# Patient Record
Sex: Female | Born: 1935 | Race: White | Hispanic: No | Smoking: Former smoker
Health system: Southern US, Community
[De-identification: ages and names within clinical notes are randomized; demographics above are authoritative.]

## PROBLEM LIST (undated history)

## (undated) DIAGNOSIS — J439 Emphysema, unspecified: Secondary | ICD-10-CM

## (undated) DIAGNOSIS — Z9981 Dependence on supplemental oxygen: Secondary | ICD-10-CM

## (undated) DIAGNOSIS — H04129 Dry eye syndrome of unspecified lacrimal gland: Secondary | ICD-10-CM

## (undated) DIAGNOSIS — F0390 Unspecified dementia without behavioral disturbance: Secondary | ICD-10-CM

## (undated) DIAGNOSIS — F32A Depression, unspecified: Secondary | ICD-10-CM

## (undated) DIAGNOSIS — B977 Papillomavirus as the cause of diseases classified elsewhere: Secondary | ICD-10-CM

## (undated) DIAGNOSIS — M5136 Other intervertebral disc degeneration, lumbar region: Secondary | ICD-10-CM

## (undated) DIAGNOSIS — J42 Unspecified chronic bronchitis: Secondary | ICD-10-CM

## (undated) DIAGNOSIS — T7840XA Allergy, unspecified, initial encounter: Secondary | ICD-10-CM

## (undated) DIAGNOSIS — R918 Other nonspecific abnormal finding of lung field: Secondary | ICD-10-CM

## (undated) DIAGNOSIS — M51369 Other intervertebral disc degeneration, lumbar region without mention of lumbar back pain or lower extremity pain: Secondary | ICD-10-CM

## (undated) DIAGNOSIS — F329 Major depressive disorder, single episode, unspecified: Secondary | ICD-10-CM

## (undated) DIAGNOSIS — B009 Herpesviral infection, unspecified: Secondary | ICD-10-CM

## (undated) DIAGNOSIS — K219 Gastro-esophageal reflux disease without esophagitis: Secondary | ICD-10-CM

## (undated) HISTORY — PX: EYE SURGERY: SHX253

## (undated) HISTORY — DX: Papillomavirus as the cause of diseases classified elsewhere: B97.7

## (undated) HISTORY — DX: Dry eye syndrome of unspecified lacrimal gland: H04.129

## (undated) HISTORY — DX: Other intervertebral disc degeneration, lumbar region: M51.36

## (undated) HISTORY — DX: Gastro-esophageal reflux disease without esophagitis: K21.9

## (undated) HISTORY — DX: Allergy, unspecified, initial encounter: T78.40XA

## (undated) HISTORY — DX: Herpesviral infection, unspecified: B00.9

## (undated) HISTORY — PX: OTHER SURGICAL HISTORY: SHX169

## (undated) HISTORY — DX: Other nonspecific abnormal finding of lung field: R91.8

## (undated) HISTORY — DX: Depression, unspecified: F32.A

## (undated) HISTORY — DX: Major depressive disorder, single episode, unspecified: F32.9

## (undated) HISTORY — DX: Emphysema, unspecified: J43.9

## (undated) HISTORY — PX: LASER ABLATION OF THE CERVIX: SHX1949

## (undated) HISTORY — DX: Other intervertebral disc degeneration, lumbar region without mention of lumbar back pain or lower extremity pain: M51.369

---

## 2002-08-30 ENCOUNTER — Ambulatory Visit (HOSPITAL_COMMUNITY): Admission: RE | Admit: 2002-08-30 | Discharge: 2002-08-30 | Payer: Self-pay | Admitting: Pulmonary Disease

## 2002-10-06 ENCOUNTER — Ambulatory Visit (HOSPITAL_COMMUNITY): Admission: RE | Admit: 2002-10-06 | Discharge: 2002-10-06 | Payer: Self-pay | Admitting: Pulmonary Disease

## 2002-12-31 ENCOUNTER — Ambulatory Visit (HOSPITAL_COMMUNITY): Admission: RE | Admit: 2002-12-31 | Discharge: 2002-12-31 | Payer: Self-pay | Admitting: Pulmonary Disease

## 2003-10-24 ENCOUNTER — Ambulatory Visit (HOSPITAL_COMMUNITY): Admission: RE | Admit: 2003-10-24 | Discharge: 2003-10-24 | Payer: Self-pay | Admitting: Pulmonary Disease

## 2003-11-28 ENCOUNTER — Ambulatory Visit (HOSPITAL_COMMUNITY): Admission: RE | Admit: 2003-11-28 | Discharge: 2003-11-28 | Payer: Self-pay | Admitting: Pulmonary Disease

## 2005-01-21 ENCOUNTER — Ambulatory Visit (HOSPITAL_COMMUNITY): Admission: RE | Admit: 2005-01-21 | Discharge: 2005-01-21 | Payer: Self-pay | Admitting: Pulmonary Disease

## 2005-07-02 ENCOUNTER — Ambulatory Visit (HOSPITAL_COMMUNITY): Admission: RE | Admit: 2005-07-02 | Discharge: 2005-07-02 | Payer: Self-pay | Admitting: Pulmonary Disease

## 2005-07-23 ENCOUNTER — Ambulatory Visit: Payer: Self-pay | Admitting: Family Medicine

## 2005-07-23 ENCOUNTER — Ambulatory Visit (HOSPITAL_COMMUNITY): Admission: RE | Admit: 2005-07-23 | Discharge: 2005-07-23 | Payer: Self-pay | Admitting: Family Medicine

## 2005-07-25 ENCOUNTER — Ambulatory Visit (HOSPITAL_COMMUNITY): Admission: RE | Admit: 2005-07-25 | Discharge: 2005-07-25 | Payer: Self-pay | Admitting: Family Medicine

## 2005-07-27 ENCOUNTER — Emergency Department (HOSPITAL_COMMUNITY): Admission: EM | Admit: 2005-07-27 | Discharge: 2005-07-27 | Payer: Self-pay | Admitting: Emergency Medicine

## 2005-08-21 ENCOUNTER — Ambulatory Visit: Payer: Self-pay | Admitting: Family Medicine

## 2005-09-20 ENCOUNTER — Ambulatory Visit: Payer: Self-pay | Admitting: Family Medicine

## 2005-10-10 ENCOUNTER — Ambulatory Visit: Payer: Self-pay | Admitting: Internal Medicine

## 2005-12-19 ENCOUNTER — Ambulatory Visit (HOSPITAL_COMMUNITY): Admission: RE | Admit: 2005-12-19 | Discharge: 2005-12-19 | Payer: Self-pay | Admitting: Internal Medicine

## 2005-12-19 ENCOUNTER — Encounter (INDEPENDENT_AMBULATORY_CARE_PROVIDER_SITE_OTHER): Payer: Self-pay | Admitting: Internal Medicine

## 2005-12-19 ENCOUNTER — Ambulatory Visit: Payer: Self-pay | Admitting: Internal Medicine

## 2006-01-16 ENCOUNTER — Ambulatory Visit: Payer: Self-pay | Admitting: Family Medicine

## 2006-01-17 ENCOUNTER — Ambulatory Visit: Payer: Self-pay | Admitting: Family Medicine

## 2006-01-20 ENCOUNTER — Ambulatory Visit (HOSPITAL_COMMUNITY): Admission: RE | Admit: 2006-01-20 | Discharge: 2006-01-20 | Payer: Self-pay | Admitting: Family Medicine

## 2006-01-23 ENCOUNTER — Ambulatory Visit (HOSPITAL_COMMUNITY): Admission: RE | Admit: 2006-01-23 | Discharge: 2006-01-23 | Payer: Self-pay | Admitting: Family Medicine

## 2006-02-13 ENCOUNTER — Ambulatory Visit: Payer: Self-pay | Admitting: Family Medicine

## 2006-03-12 ENCOUNTER — Encounter: Payer: Self-pay | Admitting: Internal Medicine

## 2006-03-13 ENCOUNTER — Ambulatory Visit: Payer: Self-pay | Admitting: Family Medicine

## 2006-03-18 ENCOUNTER — Ambulatory Visit (HOSPITAL_COMMUNITY): Admission: RE | Admit: 2006-03-18 | Discharge: 2006-03-18 | Payer: Self-pay | Admitting: Family Medicine

## 2006-03-28 ENCOUNTER — Encounter (INDEPENDENT_AMBULATORY_CARE_PROVIDER_SITE_OTHER): Payer: Self-pay | Admitting: Specialist

## 2006-03-28 ENCOUNTER — Other Ambulatory Visit: Admission: RE | Admit: 2006-03-28 | Discharge: 2006-03-28 | Payer: Self-pay | Admitting: Family Medicine

## 2006-03-28 ENCOUNTER — Ambulatory Visit: Payer: Self-pay | Admitting: Family Medicine

## 2006-03-28 LAB — CONVERTED CEMR LAB: Pap Smear: ABNORMAL

## 2006-03-31 ENCOUNTER — Encounter (INDEPENDENT_AMBULATORY_CARE_PROVIDER_SITE_OTHER): Payer: Self-pay | Admitting: Family Medicine

## 2006-05-06 ENCOUNTER — Ambulatory Visit: Payer: Self-pay | Admitting: Family Medicine

## 2006-05-12 ENCOUNTER — Ambulatory Visit: Payer: Self-pay | Admitting: Family Medicine

## 2006-05-19 ENCOUNTER — Ambulatory Visit: Payer: Self-pay | Admitting: Family Medicine

## 2006-05-21 ENCOUNTER — Emergency Department (HOSPITAL_COMMUNITY): Admission: EM | Admit: 2006-05-21 | Discharge: 2006-05-21 | Payer: Self-pay | Admitting: Emergency Medicine

## 2006-06-16 ENCOUNTER — Ambulatory Visit: Payer: Self-pay | Admitting: Family Medicine

## 2006-07-07 ENCOUNTER — Ambulatory Visit: Payer: Self-pay | Admitting: Family Medicine

## 2006-07-14 ENCOUNTER — Ambulatory Visit: Payer: Self-pay | Admitting: Family Medicine

## 2006-07-15 ENCOUNTER — Ambulatory Visit (HOSPITAL_COMMUNITY): Admission: RE | Admit: 2006-07-15 | Discharge: 2006-07-15 | Payer: Self-pay | Admitting: Family Medicine

## 2006-08-04 ENCOUNTER — Ambulatory Visit: Payer: Self-pay | Admitting: Family Medicine

## 2006-08-19 ENCOUNTER — Ambulatory Visit: Payer: Self-pay | Admitting: Family Medicine

## 2006-09-16 ENCOUNTER — Ambulatory Visit: Payer: Self-pay | Admitting: Family Medicine

## 2006-09-17 ENCOUNTER — Ambulatory Visit: Payer: Self-pay | Admitting: Family Medicine

## 2006-10-21 ENCOUNTER — Ambulatory Visit: Payer: Self-pay | Admitting: Family Medicine

## 2006-10-22 ENCOUNTER — Encounter (INDEPENDENT_AMBULATORY_CARE_PROVIDER_SITE_OTHER): Payer: Self-pay | Admitting: Family Medicine

## 2006-10-22 LAB — CONVERTED CEMR LAB
Blood Glucose, Fasting: 97 mg/dL
RBC count: 4.43 10*6/uL

## 2006-11-07 ENCOUNTER — Ambulatory Visit: Payer: Self-pay | Admitting: Family Medicine

## 2006-11-08 ENCOUNTER — Encounter: Payer: Self-pay | Admitting: Family Medicine

## 2006-11-08 DIAGNOSIS — F411 Generalized anxiety disorder: Secondary | ICD-10-CM | POA: Insufficient documentation

## 2006-11-08 DIAGNOSIS — J309 Allergic rhinitis, unspecified: Secondary | ICD-10-CM | POA: Insufficient documentation

## 2006-11-08 DIAGNOSIS — K5900 Constipation, unspecified: Secondary | ICD-10-CM | POA: Insufficient documentation

## 2006-11-08 DIAGNOSIS — E785 Hyperlipidemia, unspecified: Secondary | ICD-10-CM

## 2006-11-08 DIAGNOSIS — K219 Gastro-esophageal reflux disease without esophagitis: Secondary | ICD-10-CM

## 2006-11-08 DIAGNOSIS — F329 Major depressive disorder, single episode, unspecified: Secondary | ICD-10-CM

## 2006-11-08 DIAGNOSIS — M810 Age-related osteoporosis without current pathological fracture: Secondary | ICD-10-CM | POA: Insufficient documentation

## 2006-11-12 ENCOUNTER — Ambulatory Visit: Payer: Self-pay | Admitting: Family Medicine

## 2006-12-18 ENCOUNTER — Emergency Department (HOSPITAL_COMMUNITY): Admission: EM | Admit: 2006-12-18 | Discharge: 2006-12-18 | Payer: Self-pay | Admitting: Emergency Medicine

## 2006-12-20 ENCOUNTER — Emergency Department (HOSPITAL_COMMUNITY): Admission: EM | Admit: 2006-12-20 | Discharge: 2006-12-20 | Payer: Self-pay | Admitting: Emergency Medicine

## 2006-12-29 ENCOUNTER — Ambulatory Visit: Payer: Self-pay | Admitting: Family Medicine

## 2006-12-31 ENCOUNTER — Ambulatory Visit (HOSPITAL_COMMUNITY): Admission: RE | Admit: 2006-12-31 | Discharge: 2006-12-31 | Payer: Self-pay | Admitting: Obstetrics & Gynecology

## 2007-01-05 ENCOUNTER — Ambulatory Visit: Payer: Self-pay | Admitting: Family Medicine

## 2007-01-12 ENCOUNTER — Ambulatory Visit: Payer: Self-pay | Admitting: Family Medicine

## 2007-02-20 ENCOUNTER — Encounter (INDEPENDENT_AMBULATORY_CARE_PROVIDER_SITE_OTHER): Payer: Self-pay | Admitting: Family Medicine

## 2007-03-17 ENCOUNTER — Ambulatory Visit: Payer: Self-pay | Admitting: Family Medicine

## 2007-03-23 ENCOUNTER — Ambulatory Visit (HOSPITAL_COMMUNITY): Admission: RE | Admit: 2007-03-23 | Discharge: 2007-03-23 | Payer: Self-pay | Admitting: Family Medicine

## 2007-03-25 ENCOUNTER — Encounter (INDEPENDENT_AMBULATORY_CARE_PROVIDER_SITE_OTHER): Payer: Self-pay | Admitting: Family Medicine

## 2007-03-26 LAB — CONVERTED CEMR LAB
ALT: 19 units/L (ref 0–35)
AST: 22 units/L (ref 0–37)
Calcium: 9.6 mg/dL (ref 8.4–10.5)
Chloride: 103 meq/L (ref 96–112)
Creatinine, Ser: 1.06 mg/dL (ref 0.40–1.20)
Sodium: 141 meq/L (ref 135–145)
Total CHOL/HDL Ratio: 2.5
Total Protein: 6.8 g/dL (ref 6.0–8.3)
VLDL: 19 mg/dL (ref 0–40)

## 2007-03-31 ENCOUNTER — Ambulatory Visit: Payer: Self-pay | Admitting: Family Medicine

## 2007-03-31 LAB — CONVERTED CEMR LAB
Cholesterol, target level: 200 mg/dL
HDL goal, serum: 40 mg/dL
LDL Goal: 160 mg/dL

## 2007-04-20 ENCOUNTER — Encounter (INDEPENDENT_AMBULATORY_CARE_PROVIDER_SITE_OTHER): Payer: Self-pay | Admitting: Family Medicine

## 2007-05-21 ENCOUNTER — Encounter (INDEPENDENT_AMBULATORY_CARE_PROVIDER_SITE_OTHER): Payer: Self-pay | Admitting: Family Medicine

## 2007-06-01 ENCOUNTER — Encounter (INDEPENDENT_AMBULATORY_CARE_PROVIDER_SITE_OTHER): Payer: Self-pay | Admitting: Family Medicine

## 2007-06-23 ENCOUNTER — Encounter (INDEPENDENT_AMBULATORY_CARE_PROVIDER_SITE_OTHER): Payer: Self-pay | Admitting: Family Medicine

## 2007-06-23 ENCOUNTER — Telehealth (INDEPENDENT_AMBULATORY_CARE_PROVIDER_SITE_OTHER): Payer: Self-pay | Admitting: Family Medicine

## 2007-06-26 ENCOUNTER — Ambulatory Visit: Payer: Self-pay | Admitting: Family Medicine

## 2007-06-27 ENCOUNTER — Encounter (INDEPENDENT_AMBULATORY_CARE_PROVIDER_SITE_OTHER): Payer: Self-pay | Admitting: Family Medicine

## 2007-06-29 LAB — CONVERTED CEMR LAB
ALT: 19 units/L (ref 0–35)
AST: 25 units/L (ref 0–37)
Albumin: 4.3 g/dL (ref 3.5–5.2)
Basophils Absolute: 0.1 10*3/uL (ref 0.0–0.1)
Basophils Relative: 2 % — ABNORMAL HIGH (ref 0–1)
Calcium: 9.5 mg/dL (ref 8.4–10.5)
Chloride: 103 meq/L (ref 96–112)
MCHC: 32.9 g/dL (ref 30.0–36.0)
Neutro Abs: 3.2 10*3/uL (ref 1.7–7.7)
Neutrophils Relative %: 58 % (ref 43–77)
Potassium: 4.1 meq/L (ref 3.5–5.3)
RBC: 4.4 M/uL (ref 3.87–5.11)
RDW: 13.9 % (ref 11.5–14.0)
Total Protein: 6.8 g/dL (ref 6.0–8.3)

## 2007-07-01 ENCOUNTER — Encounter (INDEPENDENT_AMBULATORY_CARE_PROVIDER_SITE_OTHER): Payer: Self-pay | Admitting: Family Medicine

## 2007-07-14 ENCOUNTER — Encounter (INDEPENDENT_AMBULATORY_CARE_PROVIDER_SITE_OTHER): Payer: Self-pay | Admitting: Family Medicine

## 2007-07-20 ENCOUNTER — Encounter (INDEPENDENT_AMBULATORY_CARE_PROVIDER_SITE_OTHER): Payer: Self-pay | Admitting: Family Medicine

## 2007-07-29 ENCOUNTER — Ambulatory Visit: Payer: Self-pay | Admitting: Family Medicine

## 2007-08-03 ENCOUNTER — Encounter (INDEPENDENT_AMBULATORY_CARE_PROVIDER_SITE_OTHER): Payer: Self-pay | Admitting: Family Medicine

## 2007-08-03 ENCOUNTER — Ambulatory Visit (HOSPITAL_COMMUNITY): Admission: RE | Admit: 2007-08-03 | Discharge: 2007-08-03 | Payer: Self-pay | Admitting: Family Medicine

## 2007-08-05 ENCOUNTER — Encounter (INDEPENDENT_AMBULATORY_CARE_PROVIDER_SITE_OTHER): Payer: Self-pay | Admitting: Family Medicine

## 2007-09-03 ENCOUNTER — Ambulatory Visit: Payer: Self-pay | Admitting: Family Medicine

## 2007-09-03 LAB — CONVERTED CEMR LAB
Ketones, urine, test strip: NEGATIVE
Protein, U semiquant: NEGATIVE
Urobilinogen, UA: 0.2

## 2007-09-04 ENCOUNTER — Encounter (INDEPENDENT_AMBULATORY_CARE_PROVIDER_SITE_OTHER): Payer: Self-pay | Admitting: Family Medicine

## 2007-09-04 LAB — CONVERTED CEMR LAB: RBC / HPF: NONE SEEN (ref <3)

## 2007-09-24 ENCOUNTER — Telehealth (INDEPENDENT_AMBULATORY_CARE_PROVIDER_SITE_OTHER): Payer: Self-pay | Admitting: *Deleted

## 2007-09-29 ENCOUNTER — Telehealth (INDEPENDENT_AMBULATORY_CARE_PROVIDER_SITE_OTHER): Payer: Self-pay | Admitting: Family Medicine

## 2007-10-01 ENCOUNTER — Telehealth (INDEPENDENT_AMBULATORY_CARE_PROVIDER_SITE_OTHER): Payer: Self-pay | Admitting: *Deleted

## 2007-10-02 ENCOUNTER — Ambulatory Visit: Payer: Self-pay | Admitting: Family Medicine

## 2007-10-05 ENCOUNTER — Ambulatory Visit: Payer: Self-pay | Admitting: Family Medicine

## 2007-10-06 ENCOUNTER — Ambulatory Visit (HOSPITAL_COMMUNITY): Admission: RE | Admit: 2007-10-06 | Discharge: 2007-10-06 | Payer: Self-pay | Admitting: Family Medicine

## 2007-10-06 ENCOUNTER — Ambulatory Visit: Payer: Self-pay | Admitting: Family Medicine

## 2007-10-06 LAB — CONVERTED CEMR LAB
ALT: 22 units/L (ref 0–35)
AST: 24 units/L (ref 0–37)
CK-MB: 2.4 ng/mL (ref 0.3–4.0)
Calcium: 9.1 mg/dL (ref 8.4–10.5)
Chloride: 102 meq/L (ref 96–112)
Creatinine, Ser: 0.96 mg/dL (ref 0.40–1.20)

## 2007-10-07 ENCOUNTER — Telehealth (INDEPENDENT_AMBULATORY_CARE_PROVIDER_SITE_OTHER): Payer: Self-pay | Admitting: *Deleted

## 2007-10-07 ENCOUNTER — Ambulatory Visit: Payer: Self-pay | Admitting: Family Medicine

## 2007-10-21 ENCOUNTER — Ambulatory Visit: Payer: Self-pay | Admitting: Family Medicine

## 2007-10-23 ENCOUNTER — Telehealth (INDEPENDENT_AMBULATORY_CARE_PROVIDER_SITE_OTHER): Payer: Self-pay | Admitting: *Deleted

## 2007-10-26 ENCOUNTER — Ambulatory Visit: Payer: Self-pay | Admitting: Family Medicine

## 2007-11-07 ENCOUNTER — Emergency Department (HOSPITAL_COMMUNITY): Admission: EM | Admit: 2007-11-07 | Discharge: 2007-11-07 | Payer: Self-pay | Admitting: Emergency Medicine

## 2007-11-11 ENCOUNTER — Ambulatory Visit: Payer: Self-pay | Admitting: Family Medicine

## 2007-11-21 ENCOUNTER — Encounter (INDEPENDENT_AMBULATORY_CARE_PROVIDER_SITE_OTHER): Payer: Self-pay | Admitting: Family Medicine

## 2007-11-23 ENCOUNTER — Encounter (INDEPENDENT_AMBULATORY_CARE_PROVIDER_SITE_OTHER): Payer: Self-pay | Admitting: Family Medicine

## 2007-12-09 ENCOUNTER — Telehealth (INDEPENDENT_AMBULATORY_CARE_PROVIDER_SITE_OTHER): Payer: Self-pay | Admitting: Family Medicine

## 2007-12-10 ENCOUNTER — Encounter: Payer: Self-pay | Admitting: Family Medicine

## 2007-12-16 ENCOUNTER — Ambulatory Visit: Payer: Self-pay | Admitting: Family Medicine

## 2007-12-17 ENCOUNTER — Encounter (INDEPENDENT_AMBULATORY_CARE_PROVIDER_SITE_OTHER): Payer: Self-pay | Admitting: Family Medicine

## 2007-12-17 ENCOUNTER — Telehealth (INDEPENDENT_AMBULATORY_CARE_PROVIDER_SITE_OTHER): Payer: Self-pay | Admitting: *Deleted

## 2007-12-17 LAB — CONVERTED CEMR LAB
Albumin: 4.2 g/dL (ref 3.5–5.2)
CO2: 22 meq/L (ref 19–32)
Calcium: 9.2 mg/dL (ref 8.4–10.5)
Chloride: 104 meq/L (ref 96–112)
Glucose, Bld: 86 mg/dL (ref 70–99)
Magnesium: 2.1 mg/dL (ref 1.5–2.5)
Phosphorus: 3.7 mg/dL (ref 2.3–4.6)
Potassium: 4.8 meq/L (ref 3.5–5.3)
Sodium: 138 meq/L (ref 135–145)
Total Bilirubin: 0.4 mg/dL (ref 0.3–1.2)
Total Protein: 6.5 g/dL (ref 6.0–8.3)

## 2007-12-23 ENCOUNTER — Ambulatory Visit: Payer: Self-pay | Admitting: Family Medicine

## 2007-12-24 ENCOUNTER — Telehealth (INDEPENDENT_AMBULATORY_CARE_PROVIDER_SITE_OTHER): Payer: Self-pay | Admitting: Family Medicine

## 2007-12-31 ENCOUNTER — Ambulatory Visit: Payer: Self-pay | Admitting: Family Medicine

## 2008-01-22 ENCOUNTER — Telehealth (INDEPENDENT_AMBULATORY_CARE_PROVIDER_SITE_OTHER): Payer: Self-pay | Admitting: Family Medicine

## 2008-01-25 ENCOUNTER — Encounter (INDEPENDENT_AMBULATORY_CARE_PROVIDER_SITE_OTHER): Payer: Self-pay | Admitting: *Deleted

## 2008-01-25 ENCOUNTER — Encounter (INDEPENDENT_AMBULATORY_CARE_PROVIDER_SITE_OTHER): Payer: Self-pay | Admitting: Family Medicine

## 2008-02-24 ENCOUNTER — Encounter (INDEPENDENT_AMBULATORY_CARE_PROVIDER_SITE_OTHER): Payer: Self-pay | Admitting: Family Medicine

## 2008-03-23 ENCOUNTER — Ambulatory Visit: Payer: Self-pay | Admitting: Family Medicine

## 2008-03-29 ENCOUNTER — Encounter (INDEPENDENT_AMBULATORY_CARE_PROVIDER_SITE_OTHER): Payer: Self-pay | Admitting: Family Medicine

## 2008-03-30 ENCOUNTER — Telehealth (INDEPENDENT_AMBULATORY_CARE_PROVIDER_SITE_OTHER): Payer: Self-pay | Admitting: *Deleted

## 2008-03-30 LAB — CONVERTED CEMR LAB
ALT: 15 units/L (ref 0–35)
AST: 21 units/L (ref 0–37)
Basophils Absolute: 0.1 10*3/uL (ref 0.0–0.1)
CO2: 28 meq/L (ref 19–32)
Calcium: 10.1 mg/dL (ref 8.4–10.5)
Chloride: 104 meq/L (ref 96–112)
Cholesterol: 178 mg/dL (ref 0–200)
Lymphocytes Relative: 29 % (ref 12–46)
Neutro Abs: 3 10*3/uL (ref 1.7–7.7)
Neutrophils Relative %: 57 % (ref 43–77)
Platelets: 245 10*3/uL (ref 150–400)
RDW: 13.8 % (ref 11.5–15.5)
Sodium: 142 meq/L (ref 135–145)
TSH: 2.004 microintl units/mL (ref 0.350–5.50)
Total Protein: 6.9 g/dL (ref 6.0–8.3)
VLDL: 20 mg/dL (ref 0–40)

## 2008-04-05 ENCOUNTER — Encounter (INDEPENDENT_AMBULATORY_CARE_PROVIDER_SITE_OTHER): Payer: Self-pay | Admitting: Family Medicine

## 2008-04-06 ENCOUNTER — Ambulatory Visit (HOSPITAL_COMMUNITY): Admission: RE | Admit: 2008-04-06 | Discharge: 2008-04-06 | Payer: Self-pay | Admitting: Family Medicine

## 2008-04-07 ENCOUNTER — Ambulatory Visit: Payer: Self-pay | Admitting: Family Medicine

## 2008-05-26 ENCOUNTER — Encounter (INDEPENDENT_AMBULATORY_CARE_PROVIDER_SITE_OTHER): Payer: Self-pay | Admitting: Family Medicine

## 2008-06-06 ENCOUNTER — Encounter (INDEPENDENT_AMBULATORY_CARE_PROVIDER_SITE_OTHER): Payer: Self-pay | Admitting: Family Medicine

## 2008-06-14 ENCOUNTER — Ambulatory Visit: Payer: Self-pay | Admitting: Family Medicine

## 2008-06-15 ENCOUNTER — Ambulatory Visit (HOSPITAL_COMMUNITY): Admission: RE | Admit: 2008-06-15 | Discharge: 2008-06-15 | Payer: Self-pay | Admitting: Family Medicine

## 2008-06-22 ENCOUNTER — Encounter (INDEPENDENT_AMBULATORY_CARE_PROVIDER_SITE_OTHER): Payer: Self-pay | Admitting: Family Medicine

## 2008-07-12 ENCOUNTER — Ambulatory Visit: Payer: Self-pay | Admitting: Family Medicine

## 2008-07-27 ENCOUNTER — Other Ambulatory Visit: Admission: RE | Admit: 2008-07-27 | Discharge: 2008-07-27 | Payer: Self-pay | Admitting: Obstetrics & Gynecology

## 2008-07-28 LAB — CONVERTED CEMR LAB: Pap Smear: NORMAL

## 2008-08-09 ENCOUNTER — Ambulatory Visit: Payer: Self-pay | Admitting: Family Medicine

## 2008-08-10 ENCOUNTER — Encounter (INDEPENDENT_AMBULATORY_CARE_PROVIDER_SITE_OTHER): Payer: Self-pay | Admitting: Family Medicine

## 2008-09-13 ENCOUNTER — Ambulatory Visit: Payer: Self-pay | Admitting: Family Medicine

## 2008-09-21 ENCOUNTER — Encounter (INDEPENDENT_AMBULATORY_CARE_PROVIDER_SITE_OTHER): Payer: Self-pay | Admitting: Family Medicine

## 2008-10-11 ENCOUNTER — Ambulatory Visit: Payer: Self-pay | Admitting: Family Medicine

## 2008-10-13 ENCOUNTER — Ambulatory Visit (HOSPITAL_COMMUNITY): Admission: RE | Admit: 2008-10-13 | Discharge: 2008-10-13 | Payer: Self-pay | Admitting: Family Medicine

## 2008-10-24 ENCOUNTER — Encounter (INDEPENDENT_AMBULATORY_CARE_PROVIDER_SITE_OTHER): Payer: Self-pay | Admitting: Family Medicine

## 2008-10-25 ENCOUNTER — Emergency Department (HOSPITAL_COMMUNITY): Admission: EM | Admit: 2008-10-25 | Discharge: 2008-10-25 | Payer: Self-pay | Admitting: Emergency Medicine

## 2008-10-25 ENCOUNTER — Ambulatory Visit: Payer: Self-pay | Admitting: Family Medicine

## 2008-10-27 ENCOUNTER — Inpatient Hospital Stay (HOSPITAL_COMMUNITY): Admission: EM | Admit: 2008-10-27 | Discharge: 2008-10-29 | Payer: Self-pay | Admitting: Emergency Medicine

## 2008-10-27 ENCOUNTER — Ambulatory Visit: Payer: Self-pay | Admitting: Family Medicine

## 2008-10-31 ENCOUNTER — Telehealth (INDEPENDENT_AMBULATORY_CARE_PROVIDER_SITE_OTHER): Payer: Self-pay | Admitting: *Deleted

## 2008-11-08 ENCOUNTER — Ambulatory Visit: Payer: Self-pay | Admitting: Family Medicine

## 2008-11-08 DIAGNOSIS — J449 Chronic obstructive pulmonary disease, unspecified: Secondary | ICD-10-CM | POA: Insufficient documentation

## 2008-11-11 ENCOUNTER — Ambulatory Visit (HOSPITAL_COMMUNITY): Admission: RE | Admit: 2008-11-11 | Discharge: 2008-11-11 | Payer: Self-pay | Admitting: Family Medicine

## 2008-11-14 ENCOUNTER — Encounter (INDEPENDENT_AMBULATORY_CARE_PROVIDER_SITE_OTHER): Payer: Self-pay | Admitting: Family Medicine

## 2008-11-22 ENCOUNTER — Ambulatory Visit: Payer: Self-pay | Admitting: Family Medicine

## 2008-11-24 ENCOUNTER — Ambulatory Visit: Payer: Self-pay | Admitting: Family Medicine

## 2008-11-28 ENCOUNTER — Encounter (INDEPENDENT_AMBULATORY_CARE_PROVIDER_SITE_OTHER): Payer: Self-pay | Admitting: Family Medicine

## 2008-12-06 ENCOUNTER — Encounter (INDEPENDENT_AMBULATORY_CARE_PROVIDER_SITE_OTHER): Payer: Self-pay | Admitting: Family Medicine

## 2008-12-13 ENCOUNTER — Encounter (INDEPENDENT_AMBULATORY_CARE_PROVIDER_SITE_OTHER): Payer: Self-pay | Admitting: Family Medicine

## 2008-12-15 ENCOUNTER — Ambulatory Visit: Payer: Self-pay | Admitting: Internal Medicine

## 2008-12-16 ENCOUNTER — Encounter (INDEPENDENT_AMBULATORY_CARE_PROVIDER_SITE_OTHER): Payer: Self-pay | Admitting: Family Medicine

## 2008-12-19 ENCOUNTER — Telehealth (INDEPENDENT_AMBULATORY_CARE_PROVIDER_SITE_OTHER): Payer: Self-pay | Admitting: Family Medicine

## 2008-12-20 ENCOUNTER — Telehealth (INDEPENDENT_AMBULATORY_CARE_PROVIDER_SITE_OTHER): Payer: Self-pay | Admitting: *Deleted

## 2008-12-21 ENCOUNTER — Ambulatory Visit (HOSPITAL_COMMUNITY): Admission: RE | Admit: 2008-12-21 | Discharge: 2008-12-21 | Payer: Self-pay | Admitting: Internal Medicine

## 2008-12-21 ENCOUNTER — Ambulatory Visit: Payer: Self-pay | Admitting: Internal Medicine

## 2008-12-21 LAB — CONVERTED CEMR LAB
CO2: 30 meq/L (ref 19–32)
Calcium: 9.8 mg/dL (ref 8.4–10.5)
Chloride: 104 meq/L (ref 96–112)
GFR calc non Af Amer: 58 mL/min
Sodium: 141 meq/L (ref 135–145)

## 2008-12-22 ENCOUNTER — Telehealth (INDEPENDENT_AMBULATORY_CARE_PROVIDER_SITE_OTHER): Payer: Self-pay | Admitting: Family Medicine

## 2008-12-26 ENCOUNTER — Telehealth (INDEPENDENT_AMBULATORY_CARE_PROVIDER_SITE_OTHER): Payer: Self-pay | Admitting: *Deleted

## 2008-12-26 ENCOUNTER — Ambulatory Visit: Payer: Self-pay | Admitting: Cardiology

## 2008-12-28 ENCOUNTER — Ambulatory Visit: Payer: Self-pay | Admitting: Family Medicine

## 2009-01-04 ENCOUNTER — Ambulatory Visit: Payer: Self-pay | Admitting: Family Medicine

## 2009-01-20 ENCOUNTER — Ambulatory Visit: Payer: Self-pay | Admitting: Internal Medicine

## 2009-01-27 ENCOUNTER — Telehealth (INDEPENDENT_AMBULATORY_CARE_PROVIDER_SITE_OTHER): Payer: Self-pay | Admitting: *Deleted

## 2009-02-01 ENCOUNTER — Telehealth: Payer: Self-pay | Admitting: Internal Medicine

## 2009-02-03 ENCOUNTER — Telehealth: Payer: Self-pay | Admitting: Internal Medicine

## 2009-02-10 ENCOUNTER — Ambulatory Visit: Payer: Self-pay | Admitting: Family Medicine

## 2009-03-03 ENCOUNTER — Ambulatory Visit: Payer: Self-pay | Admitting: Family Medicine

## 2009-03-21 ENCOUNTER — Ambulatory Visit: Payer: Self-pay | Admitting: Internal Medicine

## 2009-03-23 ENCOUNTER — Telehealth: Payer: Self-pay | Admitting: Internal Medicine

## 2009-03-28 ENCOUNTER — Encounter (INDEPENDENT_AMBULATORY_CARE_PROVIDER_SITE_OTHER): Payer: Self-pay | Admitting: Family Medicine

## 2009-04-19 ENCOUNTER — Ambulatory Visit (HOSPITAL_COMMUNITY): Admission: RE | Admit: 2009-04-19 | Discharge: 2009-04-19 | Payer: Self-pay | Admitting: Family Medicine

## 2009-06-02 ENCOUNTER — Ambulatory Visit: Payer: Self-pay | Admitting: Family Medicine

## 2009-06-05 ENCOUNTER — Encounter (INDEPENDENT_AMBULATORY_CARE_PROVIDER_SITE_OTHER): Payer: Self-pay | Admitting: Family Medicine

## 2009-06-06 LAB — CONVERTED CEMR LAB
ALT: 18 units/L (ref 0–35)
AST: 26 units/L (ref 0–37)
Albumin: 4.7 g/dL (ref 3.5–5.2)
Alkaline Phosphatase: 63 units/L (ref 39–117)
Basophils Absolute: 0.1 10*3/uL (ref 0.0–0.1)
Calcium: 9.7 mg/dL (ref 8.4–10.5)
Chloride: 102 meq/L (ref 96–112)
Eosinophils Relative: 4 % (ref 0–5)
HCT: 41.8 % (ref 36.0–46.0)
HDL: 79 mg/dL (ref >39)
LDL Cholesterol: 79 mg/dL (ref 0–99)
Lymphocytes Relative: 33 % (ref 12–46)
Lymphs Abs: 1.5 10*3/uL (ref 0.7–4.0)
Neutro Abs: 2.4 10*3/uL (ref 1.7–7.7)
Neutrophils Relative %: 53 % (ref 43–77)
Platelets: 216 10*3/uL (ref 150–400)
Potassium: 4.9 meq/L (ref 3.5–5.3)
RDW: 15.2 % (ref 11.5–15.5)
Sodium: 139 meq/L (ref 135–145)
TSH: 1.305 microintl units/mL (ref 0.350–4.500)
Total Protein: 6.5 g/dL (ref 6.0–8.3)
WBC: 4.6 10*3/uL (ref 4.0–10.5)

## 2009-06-15 ENCOUNTER — Encounter (INDEPENDENT_AMBULATORY_CARE_PROVIDER_SITE_OTHER): Payer: Self-pay | Admitting: Family Medicine

## 2009-06-20 ENCOUNTER — Telehealth (INDEPENDENT_AMBULATORY_CARE_PROVIDER_SITE_OTHER): Payer: Self-pay | Admitting: *Deleted

## 2009-07-05 ENCOUNTER — Ambulatory Visit: Payer: Self-pay | Admitting: Cardiology

## 2009-07-14 ENCOUNTER — Encounter (INDEPENDENT_AMBULATORY_CARE_PROVIDER_SITE_OTHER): Payer: Self-pay | Admitting: Family Medicine

## 2009-07-14 ENCOUNTER — Ambulatory Visit: Payer: Self-pay | Admitting: Internal Medicine

## 2009-07-14 DIAGNOSIS — J984 Other disorders of lung: Secondary | ICD-10-CM

## 2009-07-17 ENCOUNTER — Telehealth (INDEPENDENT_AMBULATORY_CARE_PROVIDER_SITE_OTHER): Payer: Self-pay | Admitting: Family Medicine

## 2009-07-31 ENCOUNTER — Encounter: Payer: Self-pay | Admitting: Internal Medicine

## 2009-08-04 ENCOUNTER — Other Ambulatory Visit: Admission: RE | Admit: 2009-08-04 | Discharge: 2009-08-04 | Payer: Self-pay | Admitting: Obstetrics & Gynecology

## 2009-08-08 ENCOUNTER — Telehealth (INDEPENDENT_AMBULATORY_CARE_PROVIDER_SITE_OTHER): Payer: Self-pay | Admitting: *Deleted

## 2009-08-23 ENCOUNTER — Telehealth: Payer: Self-pay | Admitting: Internal Medicine

## 2009-08-24 ENCOUNTER — Encounter (INDEPENDENT_AMBULATORY_CARE_PROVIDER_SITE_OTHER): Payer: Self-pay | Admitting: Family Medicine

## 2009-08-24 ENCOUNTER — Emergency Department (HOSPITAL_COMMUNITY): Admission: EM | Admit: 2009-08-24 | Discharge: 2009-08-24 | Payer: Self-pay | Admitting: Emergency Medicine

## 2009-09-21 ENCOUNTER — Encounter: Payer: Self-pay | Admitting: Internal Medicine

## 2010-01-16 ENCOUNTER — Ambulatory Visit: Payer: Self-pay | Admitting: Cardiology

## 2010-01-29 ENCOUNTER — Ambulatory Visit: Payer: Self-pay | Admitting: Internal Medicine

## 2010-02-28 ENCOUNTER — Encounter: Payer: Self-pay | Admitting: Internal Medicine

## 2010-03-07 ENCOUNTER — Telehealth: Payer: Self-pay | Admitting: Internal Medicine

## 2010-04-11 ENCOUNTER — Ambulatory Visit: Payer: Self-pay | Admitting: Internal Medicine

## 2010-05-10 ENCOUNTER — Ambulatory Visit (HOSPITAL_COMMUNITY): Admission: RE | Admit: 2010-05-10 | Discharge: 2010-05-10 | Payer: Self-pay | Admitting: Family Medicine

## 2010-05-15 ENCOUNTER — Telehealth: Payer: Self-pay | Admitting: Internal Medicine

## 2010-05-16 ENCOUNTER — Encounter: Payer: Self-pay | Admitting: Internal Medicine

## 2010-05-29 ENCOUNTER — Encounter: Payer: Self-pay | Admitting: Internal Medicine

## 2010-06-06 ENCOUNTER — Emergency Department (HOSPITAL_COMMUNITY): Admission: EM | Admit: 2010-06-06 | Discharge: 2010-06-06 | Payer: Self-pay | Admitting: Emergency Medicine

## 2010-06-08 ENCOUNTER — Telehealth (INDEPENDENT_AMBULATORY_CARE_PROVIDER_SITE_OTHER): Payer: Self-pay | Admitting: *Deleted

## 2010-06-14 ENCOUNTER — Telehealth (INDEPENDENT_AMBULATORY_CARE_PROVIDER_SITE_OTHER): Payer: Self-pay | Admitting: *Deleted

## 2010-06-26 ENCOUNTER — Ambulatory Visit: Payer: Self-pay | Admitting: Internal Medicine

## 2010-07-24 ENCOUNTER — Ambulatory Visit: Payer: Self-pay | Admitting: Internal Medicine

## 2010-08-06 ENCOUNTER — Other Ambulatory Visit: Admission: RE | Admit: 2010-08-06 | Discharge: 2010-08-06 | Payer: Self-pay | Admitting: Obstetrics & Gynecology

## 2010-08-09 ENCOUNTER — Ambulatory Visit (HOSPITAL_COMMUNITY): Admission: RE | Admit: 2010-08-09 | Discharge: 2010-08-09 | Payer: Self-pay | Admitting: Family Medicine

## 2010-08-25 ENCOUNTER — Encounter: Payer: Self-pay | Admitting: Internal Medicine

## 2010-09-27 ENCOUNTER — Telehealth: Payer: Self-pay | Admitting: Internal Medicine

## 2010-10-05 ENCOUNTER — Ambulatory Visit: Payer: Self-pay | Admitting: Internal Medicine

## 2010-10-22 ENCOUNTER — Ambulatory Visit: Payer: Self-pay | Admitting: Internal Medicine

## 2010-10-26 ENCOUNTER — Encounter: Payer: Self-pay | Admitting: Internal Medicine

## 2010-11-05 ENCOUNTER — Ambulatory Visit: Payer: Self-pay | Admitting: Internal Medicine

## 2010-12-13 ENCOUNTER — Ambulatory Visit: Payer: Self-pay | Admitting: Cardiology

## 2010-12-30 ENCOUNTER — Encounter: Payer: Self-pay | Admitting: Family Medicine

## 2011-01-06 LAB — CONVERTED CEMR LAB: Inflenza A Ag: NEGATIVE

## 2011-01-08 NOTE — Letter (Signed)
Summary: CMN for Oxygen/[facility]  CMN for Oxygen/[facility]   Imported By: Sherian Rein 03/15/2010 11:14:59  _____________________________________________________________________  External Attachment:    Type:   Image     Comment:   External Document
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
## 2011-01-08 NOTE — Progress Notes (Signed)
Summary: 02  Phone Note Call from Patient Call back at Home Phone [phone]   Caller: Patient Call For: Christine Garza Reason for Call: Talk to Nurse Summary of Call: pt's 02 weighs 10 lbs and she only weighs 100.  Can she get Liqid oxygen because it is must lighte? [facility] [place] Initial call taken by: Eugene Gavia,  May 15, 2010 11:36 AM  Follow-up for Phone Call        pt requesting liquid oxygen, there is a request in your lookat about this as well. Please advise.[name] CMA  May 15, 2010 11:43 AM   Additional Follow-up for Phone Call Additional follow up Details #1::        ok liquid o2 fine. Why is this our folks are always ordering the tanks and then coming back and saying "oh patient wants liquid o2" . Why cannot this be sorted out first or does the system not allow that Additional Follow-up by: Kalman Shan MD,  May 16, 2010 4:57 PM    Additional Follow-up for Phone Call Additional follow up Details #2::    Order placed. I spoke to [place] and they state that when you send an oprder to set pt up on oxygen ot needs to state "eval pt for most portable system, and that eval for liquid oxygen ok" if it is ok for the pt. this needs to be the order that is placed at start up in order to avoid the call backs. [name] CMA  May 16, 2010 5:16 PM   Additional Follow-up for Phone Call Additional follow up Details #3:: Details for Additional Follow-up Action Taken: ok got it. I will do it going forward Additional Follow-up by: Kalman Shan MD,  May 16, 2010 6:19 PM

## 2011-01-08 NOTE — Miscellaneous (Signed)
Summary: ct order  Clinical Lists Changes  Orders: Added new Referral order of Radiology Referral (Radiology) - Signed

## 2011-01-08 NOTE — Medication Information (Signed)
Summary: Order/[facility]  Order/[facility]   Imported By: Lester  05/29/2010 08:40:56  _____________________________________________________________________  External Attachment:    Type:   Image     Comment:   External Document
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
## 2011-01-08 NOTE — Letter (Signed)
Summary: Historic Patient File  Historic Patient File   Imported By: Lind Guest 09/07/2010 16:30:49  _____________________________________________________________________  External Attachment:    Type:   Image     Comment:   External Document

## 2011-01-08 NOTE — Letter (Signed)
Summary: CMN for Oxygen/[facility]  CMN for Oxygen/[facility]   Imported By: Sherian Rein 10/05/2010 07:17:19  _____________________________________________________________________  External Attachment:    Type:   Image     Comment:   External Document

## 2011-01-08 NOTE — Progress Notes (Signed)
Summary: CMN  Phone Note From Pharmacy   Caller: [facility]*-[phone]-jaretta Call For: Christine Garza  Summary of Call: Calling to verify we received CMN for patient. Initial call taken by: Lehman Prom,  September 27, 2010 11:43 AM  Follow-up for Phone Call        I have the CMN and it is in MR look-at. I advised [name] at [name] of this. [name] CMA  September 27, 2010 12:09 PM
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
## 2011-01-08 NOTE — Progress Notes (Signed)
Summary: CMN  Phone Note Call from Patient Call back at [phone]   Caller: [facility] - Minus Breeding Call For: Marchelle Gearing Reason for Call: Talk to Nurse Summary of Call: Looking for CMN for pt's oxygen that was faxed 02/27/2010. Initial call taken by: Eugene Gavia,  March 07, 2010 3:03 PM  Follow-up for Phone Call        spoke with Minus Breeding at [facility] and advised I had not received a CMN on this patietn and asked her to refax to [phone]. I received the CMN and have palced in look-at for MR to sign. [name] CMA  March 07, 2010 4:17 PM
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
## 2011-01-08 NOTE — Letter (Signed)
Summary: [facility]   Imported By: Sherian Rein 06/14/2010 14:55:15  _____________________________________________________________________  External Attachment:    Type:   Image     Comment:   External Document
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
## 2011-01-08 NOTE — Progress Notes (Signed)
Summary: needs fu  Phone Note Outgoing Call   Summary of Call: Got note from PMD. She has been losing weight. Saw him 06/03/2010. Plesae tell her to have CT chest without contrast now and see me in July. I am worried that nodules might be growing Initial call taken by: Kalman Shan MD,  June 08, 2010 2:09 PM  Follow-up for Phone Call        Done, see phone note dated 06/14/10. Follow-up by: Vernie Murders,  June 14, 2010 4:03 PM

## 2011-01-08 NOTE — Progress Notes (Signed)
Summary: mucinex not helping  Phone Note Call from Patient   Caller: Patient Call For: ramaswamy Summary of Call: mucinex is not helping . think she need to try a nebulizer [facility] [place]  Initial call taken by: Rickard Patience,  June 14, 2010 9:55 AM  Follow-up for Phone Call        Spoke with pt.  She states that at last ov she was told by MR that if her chest congestion was not improving after trying mucinex he would send her rx for nebs.  She states that the mucinex does not help at all.  She states that "it dries me up too much"- she states that her congestion is no worse than before, just no better.  Sometimes is able to produce minimal amounts of clear sputum.  No new complaints.  Please advise, thanks! Follow-up by: Vernie Murders,  June 14, 2010 12:24 PM  Additional Follow-up for Phone Call Additional follow up Details #1::        plese look at phone note from 7/1. She is having progresive weight loss. PMD notes states that. She has nodules. THerefore, she should have CT scan chest without contrast and come to see me this month itself. We can address neb issue at that time Additional Follow-up by: Kalman Shan MD,  June 14, 2010 3:55 PM    Additional Follow-up for Phone Call Additional follow up Details #2::    Spoke with pt and advised the above recs per MR.  Pt verbalized understanding.  Order sent to [facility] for ct chest. Follow-up by: Vernie Murders,  June 14, 2010 4:02 PM

## 2011-03-06 ENCOUNTER — Encounter: Payer: Self-pay | Admitting: Internal Medicine

## 2011-04-10 ENCOUNTER — Emergency Department (HOSPITAL_COMMUNITY): Payer: Medicare Other

## 2011-04-10 ENCOUNTER — Emergency Department (HOSPITAL_COMMUNITY)
Admission: EM | Admit: 2011-04-10 | Discharge: 2011-04-10 | Disposition: A | Discharge status: Home / Self Care | Payer: Medicare Other | Attending: Emergency Medicine | Admitting: Emergency Medicine

## 2011-04-10 DIAGNOSIS — J4489 Other specified chronic obstructive pulmonary disease: Secondary | ICD-10-CM | POA: Insufficient documentation

## 2011-04-10 DIAGNOSIS — Z79899 Other long term (current) drug therapy: Secondary | ICD-10-CM | POA: Insufficient documentation

## 2011-04-10 DIAGNOSIS — K5900 Constipation, unspecified: Secondary | ICD-10-CM | POA: Insufficient documentation

## 2011-04-10 DIAGNOSIS — M810 Age-related osteoporosis without current pathological fracture: Secondary | ICD-10-CM | POA: Insufficient documentation

## 2011-04-10 DIAGNOSIS — E7800 Pure hypercholesterolemia, unspecified: Secondary | ICD-10-CM | POA: Insufficient documentation

## 2011-04-10 DIAGNOSIS — K219 Gastro-esophageal reflux disease without esophagitis: Secondary | ICD-10-CM | POA: Insufficient documentation

## 2011-04-10 DIAGNOSIS — J449 Chronic obstructive pulmonary disease, unspecified: Secondary | ICD-10-CM | POA: Insufficient documentation

## 2011-04-14 ENCOUNTER — Emergency Department (HOSPITAL_COMMUNITY): Payer: Medicare Other

## 2011-04-14 ENCOUNTER — Emergency Department (HOSPITAL_COMMUNITY)
Admission: EM | Admit: 2011-04-14 | Discharge: 2011-04-14 | Disposition: A | Discharge status: Home / Self Care | Payer: Medicare Other | Attending: Emergency Medicine | Admitting: Emergency Medicine

## 2011-04-14 DIAGNOSIS — E7800 Pure hypercholesterolemia, unspecified: Secondary | ICD-10-CM | POA: Insufficient documentation

## 2011-04-14 DIAGNOSIS — M810 Age-related osteoporosis without current pathological fracture: Secondary | ICD-10-CM | POA: Insufficient documentation

## 2011-04-14 DIAGNOSIS — K5900 Constipation, unspecified: Secondary | ICD-10-CM | POA: Insufficient documentation

## 2011-04-14 DIAGNOSIS — Z79899 Other long term (current) drug therapy: Secondary | ICD-10-CM | POA: Insufficient documentation

## 2011-04-14 DIAGNOSIS — J438 Other emphysema: Secondary | ICD-10-CM | POA: Insufficient documentation

## 2011-04-14 DIAGNOSIS — K219 Gastro-esophageal reflux disease without esophagitis: Secondary | ICD-10-CM | POA: Insufficient documentation

## 2011-04-14 LAB — COMPREHENSIVE METABOLIC PANEL
BUN: 10 mg/dL (ref 6–23)
Calcium: 10.6 mg/dL — ABNORMAL HIGH (ref 8.4–10.5)
Creatinine, Ser: 1.03 mg/dL (ref 0.4–1.2)
GFR calc non Af Amer: 53 mL/min — ABNORMAL LOW (ref >60)
Glucose, Bld: 93 mg/dL (ref 70–99)
Total Protein: 7.8 g/dL (ref 6.0–8.3)

## 2011-04-14 LAB — URINALYSIS, ROUTINE W REFLEX MICROSCOPIC
Bilirubin Urine: NEGATIVE
Ketones, ur: NEGATIVE mg/dL
Nitrite: NEGATIVE
Protein, ur: NEGATIVE mg/dL
Specific Gravity, Urine: 1.01 (ref 1.005–1.030)
Urobilinogen, UA: 0.2 mg/dL (ref 0.0–1.0)

## 2011-04-14 LAB — DIFFERENTIAL
Eosinophils Absolute: 0.3 10*3/uL (ref 0.0–0.7)
Eosinophils Relative: 6 % — ABNORMAL HIGH (ref 0–5)
Lymphs Abs: 1.4 10*3/uL (ref 0.7–4.0)

## 2011-04-14 LAB — CBC
MCH: 31.5 pg (ref 26.0–34.0)
MCHC: 34.8 g/dL (ref 30.0–36.0)
MCV: 90.5 fL (ref 78.0–100.0)
Platelets: 217 10*3/uL (ref 150–400)
RDW: 13.7 % (ref 11.5–15.5)
WBC: 6 10*3/uL (ref 4.0–10.5)

## 2011-04-14 LAB — LIPASE, BLOOD: Lipase: 30 U/L (ref 11–59)

## 2011-04-14 MED ORDER — IOHEXOL 300 MG/ML  SOLN
100.0000 mL | Freq: Once | INTRAMUSCULAR | Status: AC | PRN
  Administered 2011-04-14: 100 mL via INTRAVENOUS

## 2011-04-23 NOTE — H&P (Signed)
Christine Garza, Christine Garza            ACCOUNT NO.:  [id number]   MEDICAL RECORD NO.:  [id number]          PATIENT TYPE:  INP   LOCATION:  A326                          FACILITY:  [facility]   PHYSICIAN:  Osvaldo Shipper, MD     DATE OF BIRTH:  [birth year]   DATE OF ADMISSION:  10/27/2008  DATE OF DISCHARGE:  LH                              HISTORY & PHYSICAL   PRIMARY CARE PHYSICIAN:  Franchot Heidelberg, M.D.   PULMONOLOGIST:  Elise Benne, M.D.   GYNECOLOGIST:  Lazaro Arms, M.D.   She has a pending appointment with a neurosurgeon in [place] for her  slipped disk.   ADMISSION DIAGNOSES:  1. Acute chronic obstructive pulmonary disease exacerbation.  2. History of chronic obstructive pulmonary disease.  3. History of acid reflux disease.  4. History of dyslipidemia.  5. Allergies.   CHIEF COMPLAINT:  Shortness of breath for 3 days.   HISTORY OF PRESENT ILLNESS:  The patient is a 76 year old Caucasian  female who was in her usual state of health until about Tuesday when she  started feeling short of breath mainly with exertion.  She also had a  cough with greenish/grayish expectoration.  She had a fever of 101  degrees Fahrenheit.  She went to her PMD's office and from there she was  sent over to the emergency department for further evaluation.  She  received breathing treatments in the ED and was sent home.  She felt no  better and actually got worse and then today she went to her PMD again  and from there she was admitted to the hospital.   She mentions no sick contacts.  She mentions pain from cough and pain  from gas, but otherwise no other chest pain.  She denies any hemoptysis.  She says that she has not felt that she has lost any weight.  She  mentioned the shortness of breath is worse with talking and ambulation.   MEDICATIONS:  1. Advair 500/50 b.i.d.  2. Spiriva inhaler once daily.  3. Proventil inhaler as needed.  4. Lipitor 10 mg daily.  5. Prevacid 30 mg  daily.  6. Caltrate plus D t.i.d.  7. Systane eye drops as needed.  8. Platinol b.i.d.  9. Astelin nasal spray once a day.  10.Reclast infusions once a year.  11.Zyrtec pill 1/2 tablet daily.  12.Aspirin 81 mg daily.   She is no longer on Celexa, Flexeril, or Valtrex.   ALLERGIES:  DILAUDID which causes dizziness, CEFPODOXIME which causes  itching, and BENADRYL which causes tactile hallucinations.   PAST MEDICAL HISTORY:  Positive for osteoporosis, COPD, dry eyes.  She  denies any history of diabetes or hypertension or any heart disease.  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   SOCIAL HISTORY:  She lives alone in [place].  She quit smoking many  years ago in 1967.  She smoked only for 13 years, however, has been  exposed to passive smoking all of her life.  No alcohol use, no illicit  drug use.  She is independent with daily activities.   FAMILY HISTORY:  Positive for coronary artery disease and liver cancer.   REVIEW OF SYSTEMS:  GENERAL:  Positive for weakness and malaise.  HEENT:  Unremarkable.  CARDIOVASCULAR:  Unremarkable.  RESPIRATORY:  As in HPI.  GASTROINTESTINAL:  Unremarkable.  GENITOURINARY:  Unremarkable.  MUSCULOSKELETAL:  Positive for back pain.  NEUROLOGY:  Unremarkable.  PSYCHIATRIC:  Unremarkable.  ENDOCRINE:  Unremarkable.  Other systems  unremarkable.  DERMATOLOGIC:  Unremarkable.   PHYSICAL EXAMINATION:  VITAL SIGNS:  Temperature 98.5, blood pressure  124/56, heart rate 110s, respiratory rate 28 breaths per minute,  saturation at 100% on 2 liters of nasal cannula.  GENERAL:  Elderly white female in no distress.  Slightly tachypneic.  HEENT:  There is no pallor and no icterus.  Oral mucous membrane is  moist.  No oral lesions were noted.  ENT examination was otherwise  unremarkable.  NECK:  Soft and supple.  No thyromegaly is appreciated.  LUNGS:  Reveal very poor entry bilaterally, very tight.  Some wheezing  appreciated, but not too  much.  No crackles are present.  CARDIOVASCULAR:  S1 and S2 is tachycardic, regular, no murmurs  appreciated.  No S3 and S4.  No rubs and no bruits.  ABDOMEN:  Soft, nontender, and nondistended.  Bowel sounds are present.  No masses or organomegaly is appreciated.  EXTREMITIES:  No edema.  NEUROLOGY:  She is alert and oriented x3.  No focal neurological  deficits are present.  MUSCULOSKELETAL:  Otherwise unremarkable.   LABORATORY DATA:  She had an ABG done 2 days ago which showed a pH of  7.38, pCO2 34, pO2 was 148.  Her CBC today shows a slight left shift  with 85% neutrophils, coags are normal, sodium is 132, glucose 101.  Renal function is normal.  Albumin is 3.2.  Troponin is normal.  UA  shows small bilirubin, trace ketones, small blood, trace protein, 0-2  RBCs.  Blood cultures are pending.   She had an EKG which shows a sinus tachycardic rhythm with a normal  axis.  Prominent P waves are noted.  Evidence for RBBB is also  appreciated.  No other acute ST or T wave changes of concern are noted  and intervals appear to be in the normal range.   Chest x-ray showed evidence for advanced COPD and scarring.  No acute  abnormalities noted, however, a left perihilar density is appreciated.   ASSESSMENT:  This is a 76 year old Caucasian female who presents with 3-  day history of shortness of breath and is most likely experiencing an  acute COPD exacerbation.  She also has left perihilar density and on a  CAT scan done 2 days ago there was suggestion of two satellite nodules  in the left lung.   PLAN:  1. Acute COPD exacerbation.  We will treat her with nebulizer      treatments, steroids, antibiotics.  Oxygen will be provided for      comfort at this time.  We will follow and monitor her closely.  2. Left hilar density and satellite nodules in the left lung.  The      patient mentioned that she did have a lung biopsy some time ago but      she is unable to tell me exactly when and  if that was unremarkable.      I will let PMD and her pulmonologist handle this issue.  3. Osteoporosis.  Continue with calcium and PMD to manage this      further.  Otherwise the rest of her medical issues appear to be      stable.  4. We will put her on DVT prophylaxis.  Anticipate the patient staying      in the hospital for 2-3 days.      Osvaldo Shipper, MD  Electronically Signed     GK/MEDQ  D:  10/27/2008  T:  10/27/2008  Job:  [id number]   cc:   Franchot Heidelberg, M.D.   Elise Benne, M.D.

## 2011-04-26 NOTE — Op Note (Signed)
Christine Garza, Christine Garza            ACCOUNT NO.:  [id number]   MEDICAL RECORD NO.:  [id number]          PATIENT TYPE:  AMB   LOCATION:  DAY                           FACILITY:  [facility]   PHYSICIAN:  Lazaro Arms, M.D.   DATE OF BIRTH:  12-04-1935   DATE OF PROCEDURE:  12/31/2006  DATE OF DISCHARGE:                               OPERATIVE REPORT   PREOPERATIVE DIAGNOSIS:  High-grade dysplasia of the cervix.   POSTOPERATIVE DIAGNOSIS:  High-grade dysplasia of the cervix.   PROCEDURE:  Laser ablation of cervix.   SURGEON:  Lazaro Arms, M.D.   ANESTHESIA:  Spinal.   FINDINGS:  The patient had colposcopically directed biopsies performed  in the office which revealed high-grade dysplasia.  It was once again  performed today and it did indeed reveal that.  It was an adequate  colposcopy.   DESCRIPTION OF OPERATION:  The patient was taken to the operating room  and underwent a saddle block anesthesia, placed in the supine position  and then dorsal lithotomy position.  Speculum was placed.  Holmium laser  was used.  Colposcopy was performed again using the microscope and 3%  acetic acid.  The previously evaluated findings were noted.  The laser  was placed on a power of 1.5 and 20 repeats per second and ablation of 5-  7 mm in depth was performed, essentially obliterating the endocervical  canal completely and  a good margin was obtained around all the  dysplasia.  The patient tolerated the procedure well.  There was no  bleeding and she was taken to the recovery room doing well.      Lazaro Arms, M.D.  Electronically Signed     LHE/MEDQ  D:  12/31/2006  T:  12/31/2006  Job:  [id number]

## 2011-04-26 NOTE — Op Note (Signed)
[name], Christine Garza            ACCOUNT NO.:  [id number]   MEDICAL RECORD NO.:  [id number]          PATIENT TYPE:  AMB   LOCATION:  DAY                           FACILITY:  [facility]   PHYSICIAN:  Lionel December, M.D.    DATE OF BIRTH:  Jan 04, 1935   DATE OF PROCEDURE:  12/19/2005  DATE OF DISCHARGE:                                 OPERATIVE REPORT   PROCEDURE:  Esophagogastroduodenoscopy followed by colonoscopy.   INDICATION:  Christine Garza is a 76 year old Caucasian female with chronic GERD  with atypical symptoms who has not had satisfactory response to PPI. She is  undergoing diagnostic EGD. She has history of colonic polyps. She had a  large adenoma removed from her distal sigmoid colon back in 1996, and a  follow-up exam in 1999 was normal. She is returning for surveillance  colonoscopy. Procedure risks were reviewed with the patient, and informed  consent was obtained.   MEDICINES FOR CONSCIOUS SEDATION:  Cetacaine spray for pharyngeal topical  anesthesia, Demerol 25 mg IV, Versed 5 mg IV.   FINDINGS:  Procedure performed in endoscopy suite. The patient's vital signs  and O2 saturation were monitored during the procedure and remained stable.   PROCEDURE #1:  Esophagogastroduodenoscopy. The patient was placed in  left  lateral position. Olympus videoscope was passed via oropharynx without any  difficulty into esophagus.   Esophagus. Mucosa of the esophagus normal. GE junction was at 38 cm, and she  had erythema and edema at Z line on gastric side. No hernia was noted.   Stomach. It was empty and distended very well with insufflation. Folds of  proximal stomach were normal. Examination of mucosa at body, antrum, pyloric  channel as well as angularis, fundus and cardia was normal.   Duodenum. There was a cluster of polyps at proximal bulb which was biopsied  for histology and possibly due to [facility] gland hyperplasia. Scope was  passed into the second part of the duodenum where  mucosa and folds were  normal. Endoscope was withdrawn. The patient prepared for procedure #2.   PROCEDURE #2:  Colonoscopy. Rectal examination performed. No abnormality  noted on external or digital exam. Olympus videoscope was placed in rectum  and advanced under vision into sigmoid colon and beyond. She had few small  diverticula at sigmoid colon. Preparation was excellent. Scope was passed in  cecum which was identified by appendiceal orifice and ileocecal valve.  Pictures taken for the record. There was a small sessile polyp at proximal  transverse colon. It was ablated via cold biopsy. Endoscopically, it was  suspicious for hyperplastic polyp. Mucosa of the rest of the colon was  normal. Rectal mucosa similarly was normal. Scope was retroflexed to examine  anorectal junction which was unremarkable. Endoscope was straightened and  withdrawn. The patient tolerated the procedure well.   FINAL DIAGNOSES:  1.  Mild changes of reflux esophagitis limited to GE junction.  2.  Cluster of small bulbar polyps, some of which were biopsied for      histology. 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  3.  Few small diverticula at sigmoid colon.  4.  Small sessile polyp ablated via cold biopsy from proximal transverse      colon.   RECOMMENDATIONS:  1.  She will continue anti-reflux measures. Will increase her Prevacid      SoluTab to b.i.d. for four weeks and see if she does any better.  2.  High-fiber diet.  3.  I will be contacting the patient with biopsy results.      Lionel December, M.D.  Electronically Signed     NR/MEDQ  D:  12/19/2005  T:  12/19/2005  Job:  [id number]

## 2011-05-15 ENCOUNTER — Other Ambulatory Visit (HOSPITAL_COMMUNITY): Payer: Self-pay | Admitting: Preventative Medicine

## 2011-05-15 DIAGNOSIS — R9389 Abnormal findings on diagnostic imaging of other specified body structures: Secondary | ICD-10-CM

## 2011-05-15 DIAGNOSIS — R05 Cough: Secondary | ICD-10-CM

## 2011-05-17 ENCOUNTER — Ambulatory Visit (HOSPITAL_COMMUNITY)
Admission: RE | Admit: 2011-05-17 | Discharge: 2011-05-17 | Disposition: A | Discharge status: Home / Self Care | Payer: Medicare Other | Source: Ambulatory Visit | Attending: Preventative Medicine | Admitting: Preventative Medicine

## 2011-05-17 DIAGNOSIS — R918 Other nonspecific abnormal finding of lung field: Secondary | ICD-10-CM | POA: Insufficient documentation

## 2011-05-17 DIAGNOSIS — J984 Other disorders of lung: Secondary | ICD-10-CM | POA: Insufficient documentation

## 2011-05-17 DIAGNOSIS — R059 Cough, unspecified: Secondary | ICD-10-CM | POA: Insufficient documentation

## 2011-05-17 DIAGNOSIS — R05 Cough: Secondary | ICD-10-CM | POA: Insufficient documentation

## 2011-05-17 DIAGNOSIS — J449 Chronic obstructive pulmonary disease, unspecified: Secondary | ICD-10-CM | POA: Insufficient documentation

## 2011-05-17 DIAGNOSIS — R9389 Abnormal findings on diagnostic imaging of other specified body structures: Secondary | ICD-10-CM

## 2011-05-17 DIAGNOSIS — J4489 Other specified chronic obstructive pulmonary disease: Secondary | ICD-10-CM | POA: Insufficient documentation

## 2011-05-17 MED ORDER — IOHEXOL 300 MG/ML  SOLN: 80.0000 mL | Freq: Once | INTRAMUSCULAR | Status: AC | PRN

## 2011-07-05 ENCOUNTER — Encounter: Payer: Self-pay | Admitting: Medical

## 2011-07-08 ENCOUNTER — Ambulatory Visit (INDEPENDENT_AMBULATORY_CARE_PROVIDER_SITE_OTHER): Payer: Medicare Other | Admitting: Family Medicine

## 2011-07-08 ENCOUNTER — Encounter: Payer: Self-pay | Admitting: Family Medicine

## 2011-07-08 VITALS — BP 140/80 | HR 93 | Resp 16 | Ht 60.75 in | Wt 96.0 lb

## 2011-07-08 DIAGNOSIS — E785 Hyperlipidemia, unspecified: Secondary | ICD-10-CM

## 2011-07-08 DIAGNOSIS — J984 Other disorders of lung: Secondary | ICD-10-CM

## 2011-07-08 DIAGNOSIS — J449 Chronic obstructive pulmonary disease, unspecified: Secondary | ICD-10-CM

## 2011-07-08 NOTE — Patient Instructions (Signed)
I have completed your [facility] certificate Call [facility] if you need refills Next visit in 3 months

## 2011-07-08 NOTE — Progress Notes (Signed)
  Subjective:    Patient ID: Christine Garza, female    DOB: [birth year], 76 y.o.   MRN: [id number]  HPI  Pt here to establish care, this office is closer to home. No concerns today, needs Handicap Placard form completed  COPD- Stage 3, no recent flares, currently tolerating inhalers, wears home oxygen, today does not have it on feels well Handicap placard- secondary to lung disease and oxygen use  Pulmonary nodule- pt seen at urgent care, chest CT was obtained, pt brought results with her, she has a new LLL 3mm nodule, history of previous nodules, in the past she had a biopsy, unsure if it was cancer, currently does not want any intervention for her nodules, would not like any treatment for cancer if this were the case at this time. She has not seen Pulmonary secondary to transportation issues, considering switching back to pulmonologist here in town  Reviewed medications Reviewed last set of labs done at previous PCP in [place]   Review of Systems     Objective:   Physical Exam  GEN- NAD, alert and oriented  HEENT-PERRL, EOMI, oropharnx clear, edentoulous  Neck- no carotid Bruit CVS- RRR, no murmur RESP- CTAB, normal WOB  EXT- no edema  Pulses - radial and DP 2+      Assessment & Plan:

## 2011-07-09 ENCOUNTER — Encounter: Payer: Self-pay | Admitting: Family Medicine

## 2011-07-09 NOTE — Assessment & Plan Note (Signed)
Reviewed last lipid profile in Dec 2011, LDL 64  No change to meds

## 2011-07-09 NOTE — Assessment & Plan Note (Signed)
Pt currently at baseline, home oxygen prn, continue current medications

## 2011-07-09 NOTE — Assessment & Plan Note (Signed)
Now with multiple left sided pulmonary nodules per CT report on 05/17/11. I discussed history of nodules with pt, per report had biopsy years ago, but does not want another intervention, she does not want treatment at this time. We did discuss the probability of cancer but pt does not want to pursue anything at this time.

## 2011-07-22 ENCOUNTER — Emergency Department (HOSPITAL_COMMUNITY)
Admission: EM | Admit: 2011-07-22 | Discharge: 2011-07-22 | Disposition: A | Discharge status: Home / Self Care | Payer: Medicare Other | Attending: Emergency Medicine | Admitting: Emergency Medicine

## 2011-07-22 ENCOUNTER — Encounter (HOSPITAL_COMMUNITY): Payer: Self-pay | Admitting: *Deleted

## 2011-07-22 DIAGNOSIS — J984 Other disorders of lung: Secondary | ICD-10-CM | POA: Insufficient documentation

## 2011-07-22 DIAGNOSIS — J438 Other emphysema: Secondary | ICD-10-CM | POA: Insufficient documentation

## 2011-07-22 DIAGNOSIS — M810 Age-related osteoporosis without current pathological fracture: Secondary | ICD-10-CM | POA: Insufficient documentation

## 2011-07-22 DIAGNOSIS — H04129 Dry eye syndrome of unspecified lacrimal gland: Secondary | ICD-10-CM | POA: Insufficient documentation

## 2011-07-22 DIAGNOSIS — H269 Unspecified cataract: Secondary | ICD-10-CM | POA: Insufficient documentation

## 2011-07-22 DIAGNOSIS — M25579 Pain in unspecified ankle and joints of unspecified foot: Secondary | ICD-10-CM | POA: Insufficient documentation

## 2011-07-22 DIAGNOSIS — Z87891 Personal history of nicotine dependence: Secondary | ICD-10-CM | POA: Insufficient documentation

## 2011-07-22 DIAGNOSIS — M25572 Pain in left ankle and joints of left foot: Secondary | ICD-10-CM

## 2011-07-22 NOTE — ED Notes (Signed)
Pt c/o pain in her left ankle radiating down to her toes. Pt denies injury. States that she was sitting down watching TV and started having pain. No swelling noted.

## 2011-08-22 NOTE — ED Provider Notes (Signed)
History     CSN: [id number] Arrival date & time: 07/22/2011  7:14 AM   Chief Complaint  Patient presents with  . Ankle Pain     (Include location/radiation/quality/duration/timing/severity/associated sxs/prior treatment) HPI   Past Medical History  Diagnosis Date  . Allergy   . Emphysema of lung   . Osteoporosis   . GERD (gastroesophageal reflux disease)   . Pulmonary nodules/lesions, multiple     2 nodules on left side- most recent in 2012 LLL  . HSV-1 (herpes simplex virus 1) infection     Valtrex prn Cold sores  . Depression   . Cataract     Dr. Gardenia Phlegm [place]  . Dry eye syndrome   . HPV (human papilloma virus) infection     Dr. Despina Hidden s/p lasar ablation     Past Surgical History  Procedure Date  . Eye surgery   . Hpv laser removal   . Laser ablation of the cervix     Dr.Eure    Family History  Problem Relation Age of Onset  . Cancer Mother     liver    History  Substance Use Topics  . Smoking status: Former Smoker    Quit date: 05/09/1996  . Smokeless tobacco: Not on file  . Alcohol Use: No    OB History    Grav Para Term Preterm Abortions TAB SAB Ect Mult Living                  Review of Systems  Allergies  Baclofen; Cefpodoxime proxetil; Hydromorphone hcl; and Penicillins  Home Medications   Current Outpatient Rx  Name Route Sig Dispense Refill  . ALBUTEROL SULFATE HFA 108 (90 BASE) MCG/ACT IN AERS  PRN     . ALENDRONATE SODIUM 70 MG PO TABS Oral Take 70 mg by mouth every 7 (seven) days. Take with a full glass of water on an empty stomach.     . AZELASTINE HCL 137 MCG/SPRAY NA SOLN Nasal Place 1 spray into the nose 2 (two) times daily. Use in each nostril as directed     . CALCIUM 600+D PO Oral Take 2 tablets by mouth 2 (two) times daily.      . CHLORPHENIRAMINE MALEATE 4 MG PO TABS Oral Take 4 mg by mouth daily.      . DEXLANSOPRAZOLE 60 MG PO CPDR Oral Take 60 mg by mouth daily.      [name] FLUTICASONE-SALMETEROL 230-21 MCG/ACT IN  AERO Inhalation Inhale 1 puff into the lungs 2 (two) times daily.      . OLOPATADINE HCL 0.2 % OP SOLN Ophthalmic Apply to eye. PRN for itching eyes     . TIOTROPIUM BROMIDE MONOHYDRATE 18 MCG IN CAPS Inhalation Place 18 mcg into inhaler and inhale daily.      [name] VALACYCLOVIR HCL 1 G PO TABS  Two tabs by mouth twice daily for one day. PRN       Physical Exam    BP 147/78  Pulse 84  Temp(Src) 97.6 F (36.4 C) (Oral)  Resp 16  Ht 5' (1.524 m)  Wt 97 lb (43.999 kg)  BMI 18.94 kg/m2  SpO2 100%  Physical Exam  ED Course  Procedures  Results for orders placed during the hospital encounter of 04/14/11  DIFFERENTIAL      Component Value Range   Neutrophils Relative 62  43 - 77 (%)   Neutro Abs 3.7  1.7 - 7.7 (K/uL)   Lymphocytes Relative 23  12 -  46 (%)   Lymphs Abs 1.4  0.7 - 4.0 (K/uL)   Monocytes Relative 8  3 - 12 (%)   Monocytes Absolute 0.5  0.1 - 1.0 (K/uL)   Eosinophils Relative 6 (*) 0 - 5 (%)   Eosinophils Absolute 0.3  0.0 - 0.7 (K/uL)   Basophils Relative 2 (*) 0 - 1 (%)   Basophils Absolute 0.1  0.0 - 0.1 (K/uL)  CBC      Component Value Range   WBC 6.0  4.0 - 10.5 (K/uL)   RBC 4.44  3.87 - 5.11 (MIL/uL)   Hemoglobin 14.0  12.0 - 15.0 (g/dL)   HCT 78.2  95.6 - 21.3 (%)   MCV 90.5  78.0 - 100.0 (fL)   MCH 31.5  26.0 - 34.0 (pg)   MCHC 34.8  30.0 - 36.0 (g/dL)   RDW 08.6  57.8 - 46.9 (%)   Platelets 217  150 - 400 (K/uL)  URINALYSIS, ROUTINE W REFLEX MICROSCOPIC      Component Value Range   Color, Urine YELLOW  YELLOW    Appearance CLEAR  CLEAR    Specific Gravity, Urine 1.010  1.005 - 1.030    pH 7.0  5.0 - 8.0    Glucose, UA NEGATIVE  NEGATIVE (mg/dL)   Hgb urine dipstick NEGATIVE  NEGATIVE    Bilirubin Urine NEGATIVE  NEGATIVE    Ketones, ur NEGATIVE  NEGATIVE (mg/dL)   Protein, ur NEGATIVE  NEGATIVE (mg/dL)   Urobilinogen, UA 0.2  0.0 - 1.0 (mg/dL)   Nitrite NEGATIVE  NEGATIVE    Leukocytes, UA    NEGATIVE    Value: NEGATIVE MICROSCOPIC NOT DONE ON  URINES WITH NEGATIVE PROTEIN, BLOOD, LEUKOCYTES, NITRITE, OR GLUCOSE <1000 mg/dL.  COMPREHENSIVE METABOLIC PANEL      Component Value Range   Sodium 138  135 - 145 (mEq/L)   Potassium 3.9  3.5 - 5.1 (mEq/L)   Chloride 98  96 - 112 (mEq/L)   CO2 30  19 - 32 (mEq/L)   Glucose, Bld 93  70 - 99 (mg/dL)   BUN 10  6 - 23 (mg/dL)   Creatinine, Ser 6.29  0.4 - 1.2 (mg/dL)   Calcium 52.8 (*) 8.4 - 10.5 (mg/dL)   Total Protein 7.8  6.0 - 8.3 (g/dL)   Albumin 4.5  3.5 - 5.2 (g/dL)   AST 32  0 - 37 (U/L)   ALT 20  0 - 35 (U/L)   Alkaline Phosphatase 81  39 - 117 (U/L)   Total Bilirubin 0.4  0.3 - 1.2 (mg/dL)   GFR calc non Af Amer 53 (*) >60 (mL/min)   GFR calc Af Amer    >60 (mL/min)   Value: >60            The eGFR has been calculated     using the MDRD equation.     This calculation has not been     validated in all clinical     situations.     eGFR's persistently     <60 mL/min signify     possible Chronic Kidney Disease.  LIPASE, BLOOD      Component Value Range   Lipase 30  11 - 59 (U/L)   No results found.   1. Ankle pain, left      MDM Ankle injury       Nicholes Stairs, MD 08/22/11 1734

## 2011-09-10 LAB — CULTURE, BLOOD (ROUTINE X 2)
Culture: NO GROWTH
Culture: NO GROWTH
Report Status: 11242009
Report Status: 11242009

## 2011-09-10 LAB — BLOOD GAS, ARTERIAL
Acid-base deficit: 3.9 — ABNORMAL HIGH
Bicarbonate: 20.3
O2 Saturation: 99.2
Patient temperature: 37
TCO2: 18.3
pH, Arterial: 7.384

## 2011-09-10 LAB — POCT CARDIAC MARKERS
CKMB, poc: 15.1
CKMB, poc: 3.7
Myoglobin, poc: 370
Myoglobin, poc: >500
Troponin i, poc: <0.05
Troponin i, poc: <0.05

## 2011-09-10 LAB — URINALYSIS, ROUTINE W REFLEX MICROSCOPIC
Glucose, UA: NEGATIVE
Leukocytes, UA: NEGATIVE
Nitrite: NEGATIVE
Specific Gravity, Urine: 1.02
Urobilinogen, UA: 0.2
pH: 5

## 2011-09-10 LAB — CBC
HCT: 36.1
HCT: 36.7
Hemoglobin: 12.5
Hemoglobin: 12.5
MCHC: 34.1
MCV: 94.4
Platelets: 191
Platelets: 259
RBC: 3.88
RDW: 14.2
WBC: 10.6 — ABNORMAL HIGH
WBC: 7.2

## 2011-09-10 LAB — COMPREHENSIVE METABOLIC PANEL WITH GFR
ALT: 30
Alkaline Phosphatase: 79
Glucose, Bld: 101 — ABNORMAL HIGH
Potassium: 3.5
Sodium: 132 — ABNORMAL LOW
Total Protein: 6.6

## 2011-09-10 LAB — DIFFERENTIAL
Basophils Absolute: 0
Basophils Absolute: 0
Basophils Relative: 0
Eosinophils Absolute: 0
Eosinophils Relative: 0
Eosinophils Relative: 0
Lymphocytes Relative: 7 — ABNORMAL LOW
Lymphocytes Relative: 8 — ABNORMAL LOW
Lymphs Abs: 0.6 — ABNORMAL LOW
Lymphs Abs: 0.8
Monocytes Absolute: 0.5
Monocytes Absolute: 0.6
Monocytes Relative: 7
Neutro Abs: 6.1
Neutro Abs: 9.2 — ABNORMAL HIGH
Neutrophils Relative %: 85 — ABNORMAL HIGH

## 2011-09-10 LAB — GLUCOSE, CAPILLARY
Glucose-Capillary: 110 — ABNORMAL HIGH
Glucose-Capillary: 130 — ABNORMAL HIGH
Glucose-Capillary: 141 — ABNORMAL HIGH
Glucose-Capillary: 144 — ABNORMAL HIGH
Glucose-Capillary: 147 — ABNORMAL HIGH
Glucose-Capillary: 158 — ABNORMAL HIGH
Glucose-Capillary: 161 — ABNORMAL HIGH

## 2011-09-10 LAB — BASIC METABOLIC PANEL
Calcium: 8.9
GFR calc non Af Amer: 47 — ABNORMAL LOW
Potassium: 4.4
Sodium: 132 — ABNORMAL LOW

## 2011-09-10 LAB — COMPREHENSIVE METABOLIC PANEL
AST: 36
Albumin: 3.2 — ABNORMAL LOW
BUN: 12
CO2: 26
Calcium: 8.9
Chloride: 97
Creatinine, Ser: 1.03
GFR calc Af Amer: >60
GFR calc non Af Amer: 53 — ABNORMAL LOW
Total Bilirubin: 0.6

## 2011-09-10 LAB — APTT: aPTT: 30

## 2011-09-10 LAB — CK TOTAL AND CKMB (NOT AT ARMC)
CK, MB: 8.2 — ABNORMAL HIGH
Relative Index: 4.7 — ABNORMAL HIGH
Total CK: 175

## 2011-09-10 LAB — PROTIME-INR
INR: 1.1
Prothrombin Time: 14.7

## 2011-09-10 LAB — URINE MICROSCOPIC-ADD ON

## 2011-09-10 LAB — TROPONIN I: Troponin I: 0.01

## 2011-09-10 LAB — B-NATRIURETIC PEPTIDE (CONVERTED LAB): Pro B Natriuretic peptide (BNP): <30

## 2011-09-10 LAB — D-DIMER, QUANTITATIVE: D-Dimer, Quant: 0.75 — ABNORMAL HIGH

## 2011-09-13 ENCOUNTER — Telehealth: Payer: Self-pay | Admitting: Family Medicine

## 2011-09-13 LAB — GLUCOSE, CAPILLARY: Glucose-Capillary: 96 mg/dL (ref 70–99)

## 2011-09-13 MED ORDER — OLOPATADINE HCL 0.2 % OP SOLN: OPHTHALMIC | Status: DC

## 2011-09-13 NOTE — Telephone Encounter (Signed)
Med refilled as requested

## 2011-09-30 ENCOUNTER — Encounter (HOSPITAL_COMMUNITY): Payer: Self-pay | Admitting: Emergency Medicine

## 2011-09-30 ENCOUNTER — Emergency Department (HOSPITAL_COMMUNITY)
Admission: EM | Admit: 2011-09-30 | Discharge: 2011-09-30 | Disposition: A | Discharge status: Home / Self Care | Payer: Medicare Other | Attending: Emergency Medicine | Admitting: Emergency Medicine

## 2011-09-30 DIAGNOSIS — H269 Unspecified cataract: Secondary | ICD-10-CM | POA: Insufficient documentation

## 2011-09-30 DIAGNOSIS — M810 Age-related osteoporosis without current pathological fracture: Secondary | ICD-10-CM | POA: Insufficient documentation

## 2011-09-30 DIAGNOSIS — R259 Unspecified abnormal involuntary movements: Secondary | ICD-10-CM | POA: Insufficient documentation

## 2011-09-30 DIAGNOSIS — R251 Tremor, unspecified: Secondary | ICD-10-CM

## 2011-09-30 DIAGNOSIS — K219 Gastro-esophageal reflux disease without esophagitis: Secondary | ICD-10-CM | POA: Insufficient documentation

## 2011-09-30 DIAGNOSIS — I517 Cardiomegaly: Secondary | ICD-10-CM | POA: Insufficient documentation

## 2011-09-30 DIAGNOSIS — Z87891 Personal history of nicotine dependence: Secondary | ICD-10-CM | POA: Insufficient documentation

## 2011-09-30 DIAGNOSIS — J438 Other emphysema: Secondary | ICD-10-CM | POA: Insufficient documentation

## 2011-09-30 DIAGNOSIS — J984 Other disorders of lung: Secondary | ICD-10-CM | POA: Insufficient documentation

## 2011-09-30 DIAGNOSIS — B977 Papillomavirus as the cause of diseases classified elsewhere: Secondary | ICD-10-CM | POA: Insufficient documentation

## 2011-09-30 DIAGNOSIS — H04129 Dry eye syndrome of unspecified lacrimal gland: Secondary | ICD-10-CM | POA: Insufficient documentation

## 2011-09-30 DIAGNOSIS — B009 Herpesviral infection, unspecified: Secondary | ICD-10-CM | POA: Insufficient documentation

## 2011-09-30 LAB — BASIC METABOLIC PANEL
CO2: 30 mEq/L (ref 19–32)
Calcium: 10.1 mg/dL (ref 8.4–10.5)
Glucose, Bld: 87 mg/dL (ref 70–99)
Potassium: 4.5 mEq/L (ref 3.5–5.1)
Sodium: 137 mEq/L (ref 135–145)

## 2011-09-30 LAB — DIFFERENTIAL
Eosinophils Relative: 4 % (ref 0–5)
Lymphocytes Relative: 29 % (ref 12–46)
Lymphs Abs: 1.1 10*3/uL (ref 0.7–4.0)
Monocytes Relative: 10 % (ref 3–12)
Neutrophils Relative %: 55 % (ref 43–77)

## 2011-09-30 LAB — CBC
Hemoglobin: 13.1 g/dL (ref 12.0–15.0)
MCV: 93.5 fL (ref 78.0–100.0)
Platelets: 200 10*3/uL (ref 150–400)
RBC: 4.14 MIL/uL (ref 3.87–5.11)
WBC: 3.8 10*3/uL — ABNORMAL LOW (ref 4.0–10.5)

## 2011-09-30 NOTE — ED Provider Notes (Signed)
Medical screening examination/treatment/procedure(s) were performed by non-physician practitioner and as supervising physician I was immediately available for consultation/collaboration.   [name], MD 09/30/11 1534

## 2011-09-30 NOTE — ED Notes (Signed)
Pt states she began shaking all over last night.  Pt states she has emphysema but denies worsening of shortness of breath.  Pt states she does not feel cold.

## 2011-09-30 NOTE — ED Provider Notes (Signed)
History     CSN: [id number] Arrival date & time: 09/30/2011  8:09 AM   First MD Initiated Contact with Patient 09/30/11 [phone]      Chief Complaint  Patient presents with  . Shaking all over     (Consider location/radiation/quality/duration/timing/severity/associated sxs/prior treatment) HPI Comments: Patient developed sudden onset of tremor in her extremities last night while sleeping,  The symptoms actually woke her up.  She denies any symptoms of pain, weakness,  Confusion.  She is not cold and denies any recent fever.  She did have mild diarrhea 2 days ago (three loose stools) which resolved without intervention.  She is treated for emphysema and is baseline with her breathing status.  She does use home oxygen - 2 liters prn.  She has no new medicines,  Including no otc's except for a tylenol she took last night for arthritis pain.  She has taken this medicine before without side effect.  She has had no increased caffeine intake.  The history is provided by the patient.    Past Medical History  Diagnosis Date  . Allergy   . Emphysema of lung   . Osteoporosis   . GERD (gastroesophageal reflux disease)   . Pulmonary nodules/lesions, multiple     2 nodules on left side- most recent in 2012 LLL  . HSV-1 (herpes simplex virus 1) infection     Valtrex prn Cold sores  . Depression   . Cataract     Dr. Gardenia Phlegm [place]  . Dry eye syndrome   . HPV (human papilloma virus) infection     Dr. Despina Hidden s/p lasar ablation    Past Surgical History  Procedure Date  . Eye surgery   . Hpv laser removal   . Laser ablation of the cervix     Dr.Eure    Family History  Problem Relation Age of Onset  . Cancer Mother     liver    History  Substance Use Topics  . Smoking status: Former Smoker    Quit date: 05/09/1996  . Smokeless tobacco: Not on file  . Alcohol Use: No    OB History    Grav Para Term Preterm Abortions TAB SAB Ect Mult Living                  Review of  Systems  Constitutional: Negative for fever, chills and diaphoresis.  HENT: Negative for congestion, sore throat and neck pain.   Eyes: Negative.   Respiratory: Negative for cough, chest tightness, shortness of breath and wheezing.   Cardiovascular: Negative for chest pain and palpitations.  Gastrointestinal: Negative for nausea and abdominal pain.  Genitourinary: Negative.   Musculoskeletal: Negative for joint swelling and arthralgias.  Skin: Negative.  Negative for rash and wound.  Neurological: Positive for tremors. Negative for dizziness, seizures, speech difficulty, weakness, light-headedness, numbness and headaches.  Hematological: Negative.   Psychiatric/Behavioral: Negative.     Allergies  Baclofen; Cefpodoxime proxetil; Hydromorphone hcl; and Penicillins  Home Medications   Current Outpatient Rx  Name Route Sig Dispense Refill  . ALBUTEROL SULFATE HFA 108 (90 BASE) MCG/ACT IN AERS  PRN     . ALENDRONATE SODIUM 70 MG PO TABS Oral Take 70 mg by mouth every 7 (seven) days. Take with a full glass of water on an empty stomach.     . AZELASTINE HCL 137 MCG/SPRAY NA SOLN Nasal Place 1 spray into the nose 2 (two) times daily. Use in each nostril as directed     .  CALCIUM 600+D PO Oral Take 2 tablets by mouth 2 (two) times daily.      . CHLORPHENIRAMINE MALEATE 4 MG PO TABS Oral Take 4 mg by mouth daily.      . DEXLANSOPRAZOLE 60 MG PO CPDR Oral Take 60 mg by mouth daily.      [name] FLUTICASONE-SALMETEROL 230-21 MCG/ACT IN AERO Inhalation Inhale 1 puff into the lungs 2 (two) times daily.      . OLOPATADINE HCL 0.2 % OP SOLN  Use as needed for itching eyes 1 Bottle 1  . TIOTROPIUM BROMIDE MONOHYDRATE 18 MCG IN CAPS Inhalation Place 18 mcg into inhaler and inhale daily.      [name] VALACYCLOVIR HCL 1 G PO TABS  Two tabs by mouth twice daily for one day. PRN       BP 150/98  Pulse 101  Temp(Src) 97.8 F (36.6 C) (Oral)  Resp 32  Ht 4\' 11"  (1.499 m)  Wt 95 lb (43.092 kg)  BMI 19.19 kg/m2   SpO2 99%  Physical Exam  Nursing note and vitals reviewed. Constitutional: She is oriented to person, place, and time. She appears well-developed and well-nourished.       Appears worried,  But in no distress or pain.  HENT:  Head: Normocephalic and atraumatic.  Mouth/Throat: Oropharynx is clear and moist.  Eyes: EOM are normal. Pupils are equal, round, and reactive to light.  Neck: Normal range of motion. Neck supple.  Cardiovascular: Normal rate and normal heart sounds.   Pulmonary/Chest: Effort normal. No respiratory distress. She has no wheezes.  Abdominal: Soft. There is no tenderness.  Musculoskeletal: Normal range of motion.  Lymphadenopathy:    She has no cervical adenopathy.  Neurological: She is alert and oriented to person, place, and time. She has normal strength and normal reflexes. She displays tremor. No cranial nerve deficit or sensory deficit. Gait normal. GCS eye subscore is 4. GCS verbal subscore is 5. GCS motor subscore is 6.  Skin: Skin is warm and dry. No rash noted.  Psychiatric: She has a normal mood and affect. Her speech is normal and behavior is normal. Thought content normal. Cognition and memory are normal.    ED Course  Procedures (including critical care time)   Labs Reviewed  BASIC METABOLIC PANEL  CBC  DIFFERENTIAL   No results found.   No diagnosis found.    MDM  Tremor resolved by time of dc/  Patient without complaint.  Normal labs,  Non focal exam.  F/u pcp prn.        Candis Musa, PA 09/30/11 1128   Date: 09/30/2011  Rate: 95  Rhythm: normal sinus rhythm  QRS Axis: normal  Intervals: normal  ST/T Wave abnormalities: normal  Conduction Disutrbances:none  Narrative Interpretation: biatrial enlargement.  Q waves in anterior leads unchanged.  Old EKG Reviewed: unchanged    Candis Musa, PA 09/30/11 1217

## 2011-09-30 NOTE — ED Notes (Signed)
PA at bedside.

## 2011-10-07 ENCOUNTER — Ambulatory Visit (INDEPENDENT_AMBULATORY_CARE_PROVIDER_SITE_OTHER): Payer: Medicare Other | Admitting: Family Medicine

## 2011-10-07 ENCOUNTER — Encounter: Payer: Self-pay | Admitting: Family Medicine

## 2011-10-07 VITALS — BP 122/78 | HR 81 | Resp 16 | Ht 59.0 in | Wt 97.0 lb

## 2011-10-07 DIAGNOSIS — J449 Chronic obstructive pulmonary disease, unspecified: Secondary | ICD-10-CM

## 2011-10-07 DIAGNOSIS — Z23 Encounter for immunization: Secondary | ICD-10-CM

## 2011-10-07 DIAGNOSIS — M1990 Unspecified osteoarthritis, unspecified site: Secondary | ICD-10-CM

## 2011-10-07 NOTE — Patient Instructions (Signed)
Continue your current medications Please give the CT reports to Dr. Orson Aloe Give Dr. Despina Hidden a call to see if you need to be seen again You can continue the Tylenol Arthritis  Today you have received your Tetanus shot  Next visit in 4 months

## 2011-10-07 NOTE — Progress Notes (Signed)
  Subjective:    Patient ID: Christine Garza, female    DOB: October 20, 1935, 76 y.o.   MRN: [id number]  HPI  Emphysema-- feeling okay, uses oxygen as needed 2 L, breathing has been okay, has not used Albuterol,using Spriva and Advair, weather makes her breathing worse. Will f/u up Dr. Orson Aloe.  Received flu shot at [facility]  Arthritis- using tylenol arthritis, for knee, ankle, hands, this is working well  Needs TDAP  Seen in ED for shaking all over which resolved, no specific cause, no seizure activity, no stroke symptoms, no new meds, no illness, has not had since   Review of Systems   GEN- denies fatigue, fever, weight loss,weakness, recent illness HEENT- denies eye drainage, change in vision, nasal discharge, CVS- denies chest pain, palpitations RESP- denies SOB, cough, wheeze ABD- denies N/V, change in stools, abd pain MSK- + joint pain, muscle aches, injury Neuro- denies headache, dizziness, syncope, seizure activity      Objective:   Physical Exam GEN- NAD, alert and oriented x3, pleasant Neck- Supple, no thryomegaly Node- small shotty left cervical node CVS- RRR, no murmur RESP-CTAB, good air movement, no wheeze EXT- No edema Pulses- Radial, DP- 2+        Assessment & Plan:

## 2011-10-08 ENCOUNTER — Encounter: Payer: Self-pay | Admitting: Family Medicine

## 2011-10-08 DIAGNOSIS — M1990 Unspecified osteoarthritis, unspecified site: Secondary | ICD-10-CM | POA: Insufficient documentation

## 2011-10-08 NOTE — Assessment & Plan Note (Signed)
Currently stable, received flu shot. Continue current meds F/u with pulmonary regarding nodules

## 2011-10-08 NOTE — Assessment & Plan Note (Signed)
OTC arthritis meds, If this doesn't help try ultram

## 2011-11-11 ENCOUNTER — Encounter: Payer: Medicare Other | Admitting: Family Medicine

## 2011-11-25 ENCOUNTER — Other Ambulatory Visit: Payer: Self-pay | Admitting: Medical

## 2011-12-06 ENCOUNTER — Other Ambulatory Visit: Payer: Self-pay | Admitting: Family Medicine

## 2012-01-06 ENCOUNTER — Other Ambulatory Visit: Payer: Self-pay | Admitting: Family Medicine

## 2012-01-06 ENCOUNTER — Other Ambulatory Visit: Payer: Self-pay | Admitting: Medical

## 2012-01-27 ENCOUNTER — Other Ambulatory Visit: Payer: Self-pay | Admitting: Family Medicine

## 2012-02-03 ENCOUNTER — Other Ambulatory Visit: Payer: Self-pay | Admitting: Medical

## 2012-02-05 ENCOUNTER — Other Ambulatory Visit: Payer: Self-pay | Admitting: Family Medicine

## 2012-02-17 ENCOUNTER — Encounter: Payer: Self-pay | Admitting: Family Medicine

## 2012-02-17 ENCOUNTER — Ambulatory Visit (INDEPENDENT_AMBULATORY_CARE_PROVIDER_SITE_OTHER): Payer: Medicare Other | Admitting: Family Medicine

## 2012-02-17 VITALS — BP 116/64 | HR 106 | Resp 18 | Ht 59.0 in | Wt 99.1 lb

## 2012-02-17 DIAGNOSIS — M810 Age-related osteoporosis without current pathological fracture: Secondary | ICD-10-CM

## 2012-02-17 DIAGNOSIS — M1990 Unspecified osteoarthritis, unspecified site: Secondary | ICD-10-CM

## 2012-02-17 DIAGNOSIS — J449 Chronic obstructive pulmonary disease, unspecified: Secondary | ICD-10-CM

## 2012-02-17 DIAGNOSIS — J984 Other disorders of lung: Secondary | ICD-10-CM

## 2012-02-17 DIAGNOSIS — E785 Hyperlipidemia, unspecified: Secondary | ICD-10-CM

## 2012-02-17 NOTE — Assessment & Plan Note (Signed)
Continue Fosamax, Calcium Vit D

## 2012-02-17 NOTE — Patient Instructions (Signed)
Continue your current medications Get your labs done fasting - we will call with labs  We will have a Mammogram and PAP Smear in 1 year  F/U in  4 months

## 2012-02-17 NOTE — Assessment & Plan Note (Signed)
Currently stable. She's been unable to see her lung doctor at this time she does not require any intervention.

## 2012-02-17 NOTE — Progress Notes (Signed)
  Subjective:    Patient ID: Christine Garza, female    DOB: [birth year], 77 y.o.   MRN: [id number]  HPI  Patient here to followup on chronic medical problems. She has no concerns. Immunizations and history reviewed appear  COPD- she continues to wear oxygen 2 L at home when needed. She is feeling quite well today and has been the past few weeks. She has not had any upper respiratory infections. She states she's made multiple appointments with her lung doctor however when she shows up no one is in the office. She does not want any intervention regarding the pulmonary nodule  Hyperlipidemia- due for labs  Osteoarthritis- takes Tylenol as needed otherwise doing well P.   Review of Systems    GEN- denies fatigue, fever, weight loss,weakness, recent illness HEENT- denies eye drainage, change in vision, nasal discharge, CVS- denies chest pain, palpitations RESP- denies SOB, cough, wheeze ABD- denies N/V, change in stools, abd pain MSK- + joint pain, muscle aches, injury Neuro- denies headache, dizziness, syncope, seizure activity      Objective:   Physical Exam GEN- NAD, alert and oriented x3, pleasant Neck- Supple, no thryomegaly, no bruit CVS- RRR, no murmur RESP-CTAB, good air movement, no wheeze, decreased at bases EXT- No edema Pulses- Radial, DP- 2+       Assessment & Plan:

## 2012-02-17 NOTE — Assessment & Plan Note (Signed)
Continue over-the-counter arthritis Tylenol

## 2012-02-17 NOTE — Assessment & Plan Note (Signed)
Check FLP 

## 2012-02-17 NOTE — Assessment & Plan Note (Signed)
Patient continues to decline any workup at this time. She will let me know if she wants to proceed with any intervention and workup.

## 2012-02-22 LAB — BASIC METABOLIC PANEL
BUN: 19 mg/dL (ref 6–23)
Calcium: 10 mg/dL (ref 8.4–10.5)
Creat: 1.18 mg/dL — ABNORMAL HIGH (ref 0.50–1.10)
Glucose, Bld: 79 mg/dL (ref 70–99)
Sodium: 138 mEq/L (ref 135–145)

## 2012-02-22 LAB — LIPID PANEL
HDL: 69 mg/dL (ref >39)
Total CHOL/HDL Ratio: 3.4 Ratio
Triglycerides: 148 mg/dL (ref <150)

## 2012-02-25 ENCOUNTER — Other Ambulatory Visit: Payer: Self-pay | Admitting: Family Medicine

## 2012-03-04 ENCOUNTER — Encounter: Payer: Self-pay | Admitting: Family Medicine

## 2012-03-04 ENCOUNTER — Ambulatory Visit (INDEPENDENT_AMBULATORY_CARE_PROVIDER_SITE_OTHER): Payer: Medicare Other | Admitting: Family Medicine

## 2012-03-04 VITALS — BP 134/82 | HR 80 | Resp 15 | Ht 59.0 in | Wt 99.0 lb

## 2012-03-04 DIAGNOSIS — R350 Frequency of micturition: Secondary | ICD-10-CM | POA: Insufficient documentation

## 2012-03-04 DIAGNOSIS — E785 Hyperlipidemia, unspecified: Secondary | ICD-10-CM

## 2012-03-04 LAB — POCT URINALYSIS DIPSTICK
Bilirubin, UA: NEGATIVE
Leukocytes, UA: NEGATIVE
Nitrite, UA: NEGATIVE
Protein, UA: NEGATIVE
Urobilinogen, UA: 0.2
pH, UA: 5.5

## 2012-03-04 NOTE — Assessment & Plan Note (Signed)
Given handout on foods to help lower cholesterol, she is in very good shape and normal weight range. No CAD or DM or other risk equivalent, treatment not needed at this time.

## 2012-03-04 NOTE — Assessment & Plan Note (Signed)
New onset, UA normal, blood glucose normal. Pt to cut back on tea and caffeine, no incontinence noted.

## 2012-03-04 NOTE — Patient Instructions (Signed)
Keep previous f/u appt Look at the handout on the foods Cut back on the tea If you develop abdominal pain or burning when you urinate please let me know.

## 2012-03-04 NOTE — Progress Notes (Signed)
  Subjective:    Patient ID: Christine Garza, female    DOB: [birth year], 77 y.o.   MRN: [id number]  HPI Past 2 days pt has had urinary frequency, she denies abdominal pain, dysuria, vaginal discharge, back pain, fever. She wonders if she was drinking too much tea.  Hyperlipidemia- reviewed labs with her, she is concerned about her cholesterol but eats very well, with little fried foods or foods with high cholesterol or trans fats.   Review of Systems - per above     Objective:   Physical Exam GEN-NAD,alert and oriented x 3 CVS- RRR, no murmur RESP- CTAB ABD-NABS,SOFT,NT,ND,No CVA tenderness       Assessment & Plan:

## 2012-04-06 ENCOUNTER — Encounter: Payer: Self-pay | Admitting: Family Medicine

## 2012-04-06 ENCOUNTER — Ambulatory Visit: Payer: Medicare Other | Admitting: Family Medicine

## 2012-04-15 ENCOUNTER — Ambulatory Visit (INDEPENDENT_AMBULATORY_CARE_PROVIDER_SITE_OTHER): Payer: Medicare Other | Admitting: Family Medicine

## 2012-04-15 ENCOUNTER — Encounter: Payer: Self-pay | Admitting: Family Medicine

## 2012-04-15 VITALS — BP 122/70 | HR 91 | Resp 18 | Ht 59.0 in | Wt 102.0 lb

## 2012-04-15 DIAGNOSIS — J4489 Other specified chronic obstructive pulmonary disease: Secondary | ICD-10-CM

## 2012-04-15 DIAGNOSIS — E785 Hyperlipidemia, unspecified: Secondary | ICD-10-CM

## 2012-04-15 DIAGNOSIS — J449 Chronic obstructive pulmonary disease, unspecified: Secondary | ICD-10-CM

## 2012-04-15 NOTE — Patient Instructions (Signed)
Continue your current meds Call if you have trouble with your breathing  F/U 4 Months

## 2012-04-16 ENCOUNTER — Encounter: Payer: Self-pay | Admitting: Family Medicine

## 2012-04-16 NOTE — Progress Notes (Signed)
  Subjective:    Patient ID: Christine Garza, female    DOB: 23-May-1935, 77 y.o.   MRN: [id number]  HPI Patient here to follow up her COPD. She's been doing well, she has not needed her albuterol or nebulizer treatments. She continues to get short of breath if she overexerts herself but is still able to do most of her activities including her home without difficulty. She continues to use her oxygen as needed   Review of Systems   GEN- denies fatigue, fever, weight loss,weakness, recent illness HEENT- denies eye drainage, change in vision, nasal discharge, CVS- denies chest pain, palpitations RESP- denies SOB, cough, wheeze ABD- denies N/V, change in stools, abd pain GU- denies dysuria, hematuria, dribbling, incontinence MSK- + joint pain, muscle aches, injury Neuro- denies headache, dizziness, syncope, seizure activity      Objective:   Physical Exam GEN- NAD, alert and oriented x3, pleasant Neck- Supple, no thryomegaly, no bruit CVS- RRR, no murmur RESP-CTAB, good air movement, no wheeze,  ABD-NABS,soft, NT,ND EXT- No edema Pulses- Radial, DP- 2+        Assessment & Plan:

## 2012-04-16 NOTE — Assessment & Plan Note (Signed)
COPD currently stable. Continue home oxygen and current medications

## 2012-04-16 NOTE — Assessment & Plan Note (Signed)
She has been watching her diet. No medications at this time

## 2012-04-22 ENCOUNTER — Other Ambulatory Visit: Payer: Self-pay | Admitting: Family Medicine

## 2012-06-08 ENCOUNTER — Telehealth: Payer: Self-pay | Admitting: Family Medicine

## 2012-06-08 MED ORDER — OLOPATADINE HCL 0.2 % OP SOLN: OPHTHALMIC | Status: DC

## 2012-06-08 NOTE — Telephone Encounter (Signed)
Sent in

## 2012-06-15 ENCOUNTER — Encounter (HOSPITAL_COMMUNITY): Payer: Self-pay

## 2012-06-15 ENCOUNTER — Inpatient Hospital Stay (HOSPITAL_COMMUNITY): Payer: No Typology Code available for payment source

## 2012-06-15 ENCOUNTER — Emergency Department (HOSPITAL_COMMUNITY): Payer: No Typology Code available for payment source

## 2012-06-15 ENCOUNTER — Inpatient Hospital Stay (HOSPITAL_COMMUNITY)
Admission: EM | Admit: 2012-06-15 | Discharge: 2012-06-17 | DRG: 551 | Disposition: A | Discharge status: Home / Self Care | Payer: No Typology Code available for payment source | Attending: Neurosurgery | Admitting: Neurosurgery

## 2012-06-15 DIAGNOSIS — Z79899 Other long term (current) drug therapy: Secondary | ICD-10-CM

## 2012-06-15 DIAGNOSIS — S20219A Contusion of unspecified front wall of thorax, initial encounter: Secondary | ICD-10-CM | POA: Diagnosis present

## 2012-06-15 DIAGNOSIS — M810 Age-related osteoporosis without current pathological fracture: Secondary | ICD-10-CM | POA: Diagnosis present

## 2012-06-15 DIAGNOSIS — R079 Chest pain, unspecified: Secondary | ICD-10-CM | POA: Diagnosis present

## 2012-06-15 DIAGNOSIS — K219 Gastro-esophageal reflux disease without esophagitis: Secondary | ICD-10-CM | POA: Diagnosis present

## 2012-06-15 DIAGNOSIS — Y92410 Unspecified street and highway as the place of occurrence of the external cause: Secondary | ICD-10-CM

## 2012-06-15 DIAGNOSIS — S12600A Unspecified displaced fracture of seventh cervical vertebra, initial encounter for closed fracture: Secondary | ICD-10-CM | POA: Diagnosis present

## 2012-06-15 DIAGNOSIS — S129XXA Fracture of neck, unspecified, initial encounter: Secondary | ICD-10-CM

## 2012-06-15 DIAGNOSIS — R911 Solitary pulmonary nodule: Secondary | ICD-10-CM | POA: Diagnosis present

## 2012-06-15 DIAGNOSIS — T07XXXA Unspecified multiple injuries, initial encounter: Secondary | ICD-10-CM | POA: Diagnosis present

## 2012-06-15 DIAGNOSIS — S065X0A Traumatic subdural hemorrhage without loss of consciousness, initial encounter: Secondary | ICD-10-CM | POA: Diagnosis present

## 2012-06-15 DIAGNOSIS — M51379 Other intervertebral disc degeneration, lumbosacral region without mention of lumbar back pain or lower extremity pain: Secondary | ICD-10-CM | POA: Diagnosis present

## 2012-06-15 DIAGNOSIS — S065X9A Traumatic subdural hemorrhage with loss of consciousness of unspecified duration, initial encounter: Secondary | ICD-10-CM

## 2012-06-15 DIAGNOSIS — M5137 Other intervertebral disc degeneration, lumbosacral region: Secondary | ICD-10-CM | POA: Diagnosis present

## 2012-06-15 DIAGNOSIS — S12500A Unspecified displaced fracture of sixth cervical vertebra, initial encounter for closed fracture: Secondary | ICD-10-CM | POA: Diagnosis present

## 2012-06-15 DIAGNOSIS — S12100A Unspecified displaced fracture of second cervical vertebra, initial encounter for closed fracture: Principal | ICD-10-CM | POA: Diagnosis present

## 2012-06-15 DIAGNOSIS — J438 Other emphysema: Secondary | ICD-10-CM | POA: Diagnosis present

## 2012-06-15 DIAGNOSIS — Y998 Other external cause status: Secondary | ICD-10-CM

## 2012-06-15 LAB — CBC
HCT: 40.8 % (ref 36.0–46.0)
Hemoglobin: 14 g/dL (ref 12.0–15.0)
MCH: 31.7 pg (ref 26.0–34.0)
MCV: 92.3 fL (ref 78.0–100.0)
RBC: 4.42 MIL/uL (ref 3.87–5.11)

## 2012-06-15 LAB — BASIC METABOLIC PANEL
BUN: 10 mg/dL (ref 6–23)
CO2: 27 mEq/L (ref 19–32)
Calcium: 9.9 mg/dL (ref 8.4–10.5)
Creatinine, Ser: 1.09 mg/dL (ref 0.50–1.10)
Glucose, Bld: 120 mg/dL — ABNORMAL HIGH (ref 70–99)

## 2012-06-15 MED ORDER — ALBUTEROL SULFATE HFA 108 (90 BASE) MCG/ACT IN AERS
1.0000 | INHALATION_SPRAY | RESPIRATORY_TRACT | Status: DC | PRN
  Filled 2012-06-15: qty 6.7

## 2012-06-15 MED ORDER — DIPHENHYDRAMINE HCL 25 MG PO TABS
25.0000 mg | ORAL_TABLET | Freq: Four times a day (QID) | ORAL | Status: DC
  Administered 2012-06-15 – 2012-06-16 (×4): 25 mg via ORAL
  Filled 2012-06-15 (×10): qty 1

## 2012-06-15 MED ORDER — SENNOSIDES-DOCUSATE SODIUM 8.6-50 MG PO TABS
1.0000 | ORAL_TABLET | Freq: Two times a day (BID) | ORAL | Status: DC
  Administered 2012-06-15 – 2012-06-16 (×3): 1 via ORAL
  Filled 2012-06-15 (×3): qty 1

## 2012-06-15 MED ORDER — TIOTROPIUM BROMIDE MONOHYDRATE 18 MCG IN CAPS
18.0000 ug | ORAL_CAPSULE | Freq: Every day | RESPIRATORY_TRACT | Status: DC
  Administered 2012-06-16 – 2012-06-17 (×2): 18 ug via RESPIRATORY_TRACT
  Filled 2012-06-15: qty 5

## 2012-06-15 MED ORDER — ACETAMINOPHEN 325 MG PO TABS
650.0000 mg | ORAL_TABLET | ORAL | Status: DC | PRN
  Administered 2012-06-16: 650 mg via ORAL
  Administered 2012-06-16: 325 mg via ORAL
  Administered 2012-06-17: 650 mg via ORAL
  Filled 2012-06-15 (×3): qty 2

## 2012-06-15 MED ORDER — LABETALOL HCL 5 MG/ML IV SOLN: 10.0000 mg | INTRAVENOUS | Status: DC | PRN

## 2012-06-15 MED ORDER — ACETAMINOPHEN-CODEINE #3 300-30 MG PO TABS: 1.0000 | ORAL_TABLET | ORAL | Status: DC | PRN

## 2012-06-15 MED ORDER — FLUTICASONE-SALMETEROL 250-50 MCG/DOSE IN AEPB
1.0000 | INHALATION_SPRAY | Freq: Two times a day (BID) | RESPIRATORY_TRACT | Status: DC
  Administered 2012-06-16 – 2012-06-17 (×2): 1 via RESPIRATORY_TRACT
  Filled 2012-06-15: qty 14

## 2012-06-15 MED ORDER — BACITRACIN ZINC 500 UNIT/GM EX OINT
TOPICAL_OINTMENT | CUTANEOUS | Status: AC
  Filled 2012-06-15: qty 0.9

## 2012-06-15 MED ORDER — TETANUS-DIPHTH-ACELL PERTUSSIS 5-2.5-18.5 LF-MCG/0.5 IM SUSP
0.5000 mL | Freq: Once | INTRAMUSCULAR | Status: AC
  Administered 2012-06-15: 0.5 mL via INTRAMUSCULAR
  Filled 2012-06-15: qty 0.5

## 2012-06-15 MED ORDER — MORPHINE SULFATE 2 MG/ML IJ SOLN: 1.0000 mg | INTRAMUSCULAR | Status: DC | PRN

## 2012-06-15 MED ORDER — ALBUTEROL SULFATE (5 MG/ML) 0.5% IN NEBU
2.5000 mg | INHALATION_SOLUTION | RESPIRATORY_TRACT | Status: DC
  Administered 2012-06-16: 2.5 mg via RESPIRATORY_TRACT
  Filled 2012-06-15: qty 0.5

## 2012-06-15 MED ORDER — PANTOPRAZOLE SODIUM 40 MG IV SOLR
40.0000 mg | Freq: Every day | INTRAVENOUS | Status: DC
  Administered 2012-06-15: 40 mg via INTRAVENOUS
  Filled 2012-06-15 (×2): qty 40

## 2012-06-15 MED ORDER — HYDROCODONE-ACETAMINOPHEN 5-325 MG PO TABS
1.0000 | ORAL_TABLET | Freq: Once | ORAL | Status: AC
  Administered 2012-06-15: 1 via ORAL
  Filled 2012-06-15: qty 1

## 2012-06-15 MED ORDER — SODIUM CHLORIDE 0.9 % IV SOLN
INTRAVENOUS | Status: DC
  Administered 2012-06-15: 22:00:00 via INTRAVENOUS

## 2012-06-15 MED ORDER — ACETAMINOPHEN 650 MG RE SUPP: 650.0000 mg | RECTAL | Status: DC | PRN

## 2012-06-15 MED ORDER — ONDANSETRON HCL 4 MG/2ML IJ SOLN: 4.0000 mg | Freq: Four times a day (QID) | INTRAMUSCULAR | Status: DC | PRN

## 2012-06-15 MED ORDER — CALCIUM CARBONATE-VITAMIN D 600-200 MG-UNIT PO TABS: 1.0000 | ORAL_TABLET | Freq: Once | ORAL | Status: DC

## 2012-06-15 MED ORDER — CALCIUM CARBONATE-VITAMIN D 500-200 MG-UNIT PO TABS
1.0000 | ORAL_TABLET | Freq: Once | ORAL | Status: AC
  Administered 2012-06-15: 1 via ORAL
  Filled 2012-06-15: qty 1

## 2012-06-15 MED ORDER — PANTOPRAZOLE SODIUM 40 MG PO TBEC: 40.0000 mg | DELAYED_RELEASE_TABLET | Freq: Every day | ORAL | Status: DC

## 2012-06-15 MED ORDER — FLUTICASONE-SALMETEROL 230-21 MCG/ACT IN AERO: 1.0000 | INHALATION_SPRAY | Freq: Two times a day (BID) | RESPIRATORY_TRACT | Status: DC
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
## 2012-06-16 LAB — MRSA PCR SCREENING: MRSA by PCR: NEGATIVE

## 2012-06-16 MED ORDER — ALBUTEROL SULFATE (5 MG/ML) 0.5% IN NEBU: 2.5000 mg | INHALATION_SOLUTION | Freq: Four times a day (QID) | RESPIRATORY_TRACT | Status: DC

## 2012-06-16 MED ORDER — PANTOPRAZOLE SODIUM 40 MG PO TBEC
40.0000 mg | DELAYED_RELEASE_TABLET | Freq: Every day | ORAL | Status: DC
  Filled 2012-06-16: qty 1

## 2012-06-16 MED ORDER — ALBUTEROL SULFATE HFA 108 (90 BASE) MCG/ACT IN AERS
2.0000 | INHALATION_SPRAY | RESPIRATORY_TRACT | Status: DC | PRN
  Filled 2012-06-16: qty 6.7

## 2012-06-16 MED ORDER — CYCLOSPORINE 0.05 % OP EMUL
1.0000 [drp] | Freq: Two times a day (BID) | OPHTHALMIC | Status: DC
  Administered 2012-06-16 (×2): 1 [drp] via OPHTHALMIC
  Filled 2012-06-16 (×4): qty 1

## 2012-06-16 MED FILL — Ondansetron HCl Inj 4 MG/2ML (2 MG/ML): INTRAMUSCULAR | Qty: 2 | Status: AC

## 2012-06-16 NOTE — Progress Notes (Signed)
PHARMACIST - PHYSICIAN COMMUNICATION DR:   Wynetta Emery CONCERNING: Protonix IV to Oral Route Change Policy  RECOMMENDATION: This patient is receiving Protonix by the intravenous route.  Based on criteria approved by the Pharmacy and Therapeutics Committee, this drug is being converted to the equivalent oral dose form(s).  DESCRIPTION: These criteria include:  The patient is eating (either orally or via tube) and/or has been taking other orally administered medications for a least 24 hours  There is no active GI bleed or impaired GI absorption noted.   If you have questions about this conversion, please contact the Pharmacy Department  []   [phone] )  [facility] [x]   [phone] )  [facility]  []   [phone] )  [facility] []   [phone] )  [facility]     Georgina Pillion, PharmD, BCPS Clinical Pharmacist Pager: [phone] 06/16/2012 11:50 AM
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
## 2012-06-16 NOTE — Progress Notes (Signed)
Patient for transfer to [facility] med-surg/tele room 12. Report called and given to [facility], Charity fundraiser.  Patient and VS stable upon transfer. All belongings with patient upon transfer. Continued with management and care.
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
## 2012-06-17 NOTE — Progress Notes (Signed)
Pt given D/C instructions with verbal understanding. Pt D/C'd home via wheelchair with grandson @ 0930 per MD order. Rema Fendt, RN

## 2012-06-17 NOTE — Progress Notes (Signed)
Orthopedic Tech Progress Note Patient Details:  Christine Garza 1935/08/22 [id number]  Patient ID: Gayleen Orem, female   DOB: [birth year], 77 y.o.   MRN: [id number] Replacement pads delivered by [employer].  Leo Grosser T 06/17/2012, 8:33 AM

## 2012-06-17 NOTE — Progress Notes (Signed)
Occupational Therapy Treatment and Discharge Patient Details Name: Christine Garza MRN: [id number] DOB: [birth year] Today's Date: 06/17/2012 Time: 9562-1308 OT Time Calculation (min): 13 min  OT Assessment / Plan / Recommendation Comments on Treatment Session Pt doing well- all education completed.    Follow Up Recommendations  No OT follow up;Supervision/Assistance - 24 hour    Barriers to Discharge       Equipment Recommendations  None recommended by PT;None recommended by OT    Recommendations for Other Services    Frequency     Plan All goals met and education completed, patient discharged from OT services    Precautions / Restrictions Precautions Precautions: Cervical Required Braces or Orthoses: Cervical Brace Cervical Brace: Hard collar;Applied in sitting position Restrictions Weight Bearing Restrictions: No   Pertinent Vitals/Pain Pt denies any pain at this time    ADL  Lower Body Dressing: Performed;Modified independent Where Assessed - Lower Body Dressing: Unsupported sit to stand Equipment Used: Gait belt Transfers/Ambulation Related to ADLs: Pt I with ambulation throughout room ADL Comments: Educated pt on donning/doffing hard collar and changing of pads. Pt able to verbalize and return demonstration.     OT Diagnosis:    OT Problem List:   OT Treatment Interventions:     OT Goals ADL Goals ADL Goal: Additional Goal #1 - Progress: Met ADL Goal: Additional Goal #2 - Progress: Met  Visit Information  Last OT Received On: 06/17/12 Assistance Needed: +1    Subjective Data      Prior Functioning       Cognition  Overall Cognitive Status: Appears within functional limits for tasks assessed/performed Arousal/Alertness: Awake/alert Orientation Level: Oriented X4 / Intact Behavior During Session: [facility] for tasks performed    Mobility Transfers Sit to Stand: 6: Modified independent (Device/Increase time);From chair/3-in-1;With armrests Stand to Sit:  6: Modified independent (Device/Increase time);With armrests;To chair/3-in-1   Exercises    Balance    End of Session OT - End of Session Equipment Utilized During Treatment: Gait belt;Cervical collar Activity Tolerance: Patient tolerated treatment well Patient left: in chair;with call bell/phone within reach Nurse Communication: Mobility status  GO     Christine Garza 06/17/2012, 8:25 AM
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
## 2012-06-18 ENCOUNTER — Ambulatory Visit: Payer: Medicare Other | Admitting: Family Medicine

## 2012-06-20 ENCOUNTER — Emergency Department (HOSPITAL_COMMUNITY): Payer: Medicare Other

## 2012-06-20 ENCOUNTER — Encounter (HOSPITAL_COMMUNITY): Payer: Self-pay | Admitting: Emergency Medicine

## 2012-06-20 ENCOUNTER — Inpatient Hospital Stay (HOSPITAL_COMMUNITY)
Admission: EM | Admit: 2012-06-20 | Discharge: 2012-06-22 | DRG: 191 | Disposition: A | Discharge status: Home Health | Payer: Medicare Other | Attending: Internal Medicine | Admitting: Internal Medicine

## 2012-06-20 DIAGNOSIS — Z9981 Dependence on supplemental oxygen: Secondary | ICD-10-CM

## 2012-06-20 DIAGNOSIS — Z87891 Personal history of nicotine dependence: Secondary | ICD-10-CM

## 2012-06-20 DIAGNOSIS — Z880 Allergy status to penicillin: Secondary | ICD-10-CM

## 2012-06-20 DIAGNOSIS — F329 Major depressive disorder, single episode, unspecified: Secondary | ICD-10-CM | POA: Diagnosis present

## 2012-06-20 DIAGNOSIS — F3289 Other specified depressive episodes: Secondary | ICD-10-CM | POA: Diagnosis present

## 2012-06-20 DIAGNOSIS — M5137 Other intervertebral disc degeneration, lumbosacral region: Secondary | ICD-10-CM | POA: Diagnosis present

## 2012-06-20 DIAGNOSIS — R0602 Shortness of breath: Secondary | ICD-10-CM

## 2012-06-20 DIAGNOSIS — S129XXA Fracture of neck, unspecified, initial encounter: Secondary | ICD-10-CM | POA: Diagnosis present

## 2012-06-20 DIAGNOSIS — Z885 Allergy status to narcotic agent status: Secondary | ICD-10-CM

## 2012-06-20 DIAGNOSIS — J449 Chronic obstructive pulmonary disease, unspecified: Principal | ICD-10-CM | POA: Diagnosis present

## 2012-06-20 DIAGNOSIS — E785 Hyperlipidemia, unspecified: Secondary | ICD-10-CM

## 2012-06-20 DIAGNOSIS — M51379 Other intervertebral disc degeneration, lumbosacral region without mention of lumbar back pain or lower extremity pain: Secondary | ICD-10-CM | POA: Diagnosis present

## 2012-06-20 DIAGNOSIS — K5900 Constipation, unspecified: Secondary | ICD-10-CM | POA: Diagnosis present

## 2012-06-20 DIAGNOSIS — M810 Age-related osteoporosis without current pathological fracture: Secondary | ICD-10-CM | POA: Diagnosis present

## 2012-06-20 DIAGNOSIS — M1990 Unspecified osteoarthritis, unspecified site: Secondary | ICD-10-CM

## 2012-06-20 DIAGNOSIS — J4489 Other specified chronic obstructive pulmonary disease: Principal | ICD-10-CM | POA: Diagnosis present

## 2012-06-20 DIAGNOSIS — K219 Gastro-esophageal reflux disease without esophagitis: Secondary | ICD-10-CM | POA: Diagnosis present

## 2012-06-20 DIAGNOSIS — R350 Frequency of micturition: Secondary | ICD-10-CM

## 2012-06-20 DIAGNOSIS — J984 Other disorders of lung: Secondary | ICD-10-CM

## 2012-06-20 LAB — COMPREHENSIVE METABOLIC PANEL
Alkaline Phosphatase: 78 U/L (ref 39–117)
BUN: 32 mg/dL — ABNORMAL HIGH (ref 6–23)
CO2: 26 mEq/L (ref 19–32)
GFR calc Af Amer: 67 mL/min — ABNORMAL LOW (ref >90)
GFR calc non Af Amer: 58 mL/min — ABNORMAL LOW (ref >90)
Glucose, Bld: 96 mg/dL (ref 70–99)
Potassium: 4.1 mEq/L (ref 3.5–5.1)
Total Bilirubin: 0.5 mg/dL (ref 0.3–1.2)
Total Protein: 7.4 g/dL (ref 6.0–8.3)

## 2012-06-20 LAB — CBC
HCT: 39.3 % (ref 36.0–46.0)
Hemoglobin: 13.6 g/dL (ref 12.0–15.0)
MCHC: 34.6 g/dL (ref 30.0–36.0)
RBC: 4.25 MIL/uL (ref 3.87–5.11)

## 2012-06-20 LAB — TROPONIN I: Troponin I: <0.3 ng/mL (ref <0.30)

## 2012-06-20 MED ORDER — HEPARIN SODIUM (PORCINE) 5000 UNIT/ML IJ SOLN
5000.0000 [IU] | Freq: Three times a day (TID) | INTRAMUSCULAR | Status: DC
  Administered 2012-06-20 – 2012-06-22 (×5): 5000 [IU] via SUBCUTANEOUS
  Filled 2012-06-20 (×5): qty 1

## 2012-06-20 MED ORDER — ONDANSETRON HCL 4 MG PO TABS: 4.0000 mg | ORAL_TABLET | Freq: Four times a day (QID) | ORAL | Status: DC | PRN

## 2012-06-20 MED ORDER — OXYCODONE HCL 5 MG PO TABS: 5.0000 mg | ORAL_TABLET | ORAL | Status: DC | PRN

## 2012-06-20 MED ORDER — FLUTICASONE-SALMETEROL 230-21 MCG/ACT IN AERO
1.0000 | INHALATION_SPRAY | Freq: Two times a day (BID) | RESPIRATORY_TRACT | Status: DC
  Administered 2012-06-20 – 2012-06-22 (×3): 1 via RESPIRATORY_TRACT
  Filled 2012-06-20 (×18): qty 0.1

## 2012-06-20 MED ORDER — CALCIUM CARBONATE-VITAMIN D 500-200 MG-UNIT PO TABS
1.0000 | ORAL_TABLET | Freq: Two times a day (BID) | ORAL | Status: DC
  Administered 2012-06-21 – 2012-06-22 (×3): 1 via ORAL
  Filled 2012-06-20 (×4): qty 1

## 2012-06-20 MED ORDER — IPRATROPIUM BROMIDE 0.02 % IN SOLN
0.5000 mg | Freq: Once | RESPIRATORY_TRACT | Status: AC
  Administered 2012-06-20: 0.5 mg via RESPIRATORY_TRACT
  Filled 2012-06-20: qty 2.5

## 2012-06-20 MED ORDER — PANTOPRAZOLE SODIUM 40 MG PO TBEC
40.0000 mg | DELAYED_RELEASE_TABLET | Freq: Every day | ORAL | Status: DC
  Administered 2012-06-21 – 2012-06-22 (×2): 40 mg via ORAL
  Filled 2012-06-20 (×2): qty 1

## 2012-06-20 MED ORDER — ALBUTEROL SULFATE (5 MG/ML) 0.5% IN NEBU
2.5000 mg | INHALATION_SOLUTION | RESPIRATORY_TRACT | Status: DC | PRN
  Administered 2012-06-20 – 2012-06-22 (×4): 2.5 mg via RESPIRATORY_TRACT
  Filled 2012-06-20 (×7): qty 0.5

## 2012-06-20 MED ORDER — BIOTENE DRY MOUTH MT LIQD
15.0000 mL | Freq: Two times a day (BID) | OROMUCOSAL | Status: DC
  Administered 2012-06-20 – 2012-06-22 (×4): 15 mL via OROMUCOSAL

## 2012-06-20 MED ORDER — ALBUTEROL SULFATE (5 MG/ML) 0.5% IN NEBU
2.5000 mg | INHALATION_SOLUTION | Freq: Once | RESPIRATORY_TRACT | Status: AC
  Administered 2012-06-20: 2.5 mg via RESPIRATORY_TRACT
  Filled 2012-06-20: qty 0.5

## 2012-06-20 MED ORDER — OLOPATADINE HCL 0.2 % OP SOLN: 1.0000 [drp] | Freq: Two times a day (BID) | OPHTHALMIC | Status: DC

## 2012-06-20 MED ORDER — TIOTROPIUM BROMIDE MONOHYDRATE 18 MCG IN CAPS
18.0000 ug | ORAL_CAPSULE | Freq: Every day | RESPIRATORY_TRACT | Status: DC
  Administered 2012-06-21 – 2012-06-22 (×2): 18 ug via RESPIRATORY_TRACT
  Filled 2012-06-20: qty 5

## 2012-06-20 MED ORDER — ALBUTEROL SULFATE HFA 108 (90 BASE) MCG/ACT IN AERS: 1.0000 | INHALATION_SPRAY | RESPIRATORY_TRACT | Status: DC | PRN

## 2012-06-20 MED ORDER — ONDANSETRON HCL 4 MG/2ML IJ SOLN: 4.0000 mg | Freq: Four times a day (QID) | INTRAMUSCULAR | Status: DC | PRN
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
## 2012-06-20 NOTE — ED Notes (Signed)
Report called to floor, patient alert and oriented, stable. ED tech to transport patient upstairs.

## 2012-06-20 NOTE — ED Notes (Signed)
Patient reports constipation, unsure of last BM.

## 2012-06-21 LAB — COMPREHENSIVE METABOLIC PANEL
AST: 28 U/L (ref 0–37)
BUN: 24 mg/dL — ABNORMAL HIGH (ref 6–23)
CO2: 27 mEq/L (ref 19–32)
Chloride: 96 mEq/L (ref 96–112)
Creatinine, Ser: 0.88 mg/dL (ref 0.50–1.10)
GFR calc non Af Amer: 63 mL/min — ABNORMAL LOW (ref >90)
Total Bilirubin: 0.6 mg/dL (ref 0.3–1.2)

## 2012-06-21 LAB — CBC
MCH: 31.6 pg (ref 26.0–34.0)
MCHC: 34.1 g/dL (ref 30.0–36.0)
Platelets: 222 10*3/uL (ref 150–400)
RBC: 4.3 MIL/uL (ref 3.87–5.11)

## 2012-06-21 MED ORDER — FLEET ENEMA 7-19 GM/118ML RE ENEM
1.0000 | ENEMA | Freq: Every day | RECTAL | Status: DC | PRN
  Administered 2012-06-21: 1 via RECTAL

## 2012-06-21 MED ORDER — SENNOSIDES-DOCUSATE SODIUM 8.6-50 MG PO TABS
1.0000 | ORAL_TABLET | Freq: Two times a day (BID) | ORAL | Status: DC
  Administered 2012-06-21 – 2012-06-22 (×3): 1 via ORAL
  Filled 2012-06-21 (×3): qty 1
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
## 2012-06-21 NOTE — Progress Notes (Signed)
Subjective: This lady seems to be back to her baseline in terms of her COPD. She is now having trouble with extreme constipation. We are awaiting physical therapy evaluation.           Physical Exam: Blood pressure 112/65, pulse 106, temperature 98.2 F (36.8 C), temperature source Oral, resp. rate 17, height 5\' 2"  (1.575 m), weight 42.638 kg (94 lb), SpO2 98.00%. There is no increased work of breathing. Lung fields are clear with poor air entry as before. There are no crackles or wheezes. Heart sounds are present and normal without murmurs. She is alert and orientated.   Investigations:  Recent Results (from the past 240 hour(s))  MRSA PCR SCREENING     Status: Normal   Collection Time   06/15/12 10:06 PM      Component Value Range Status Comment   MRSA by PCR NEGATIVE  NEGATIVE Final      Basic Metabolic Panel:  Basename 06/21/12 0625 06/20/12 0946  NA 136 136  K 4.1 4.1  CL 96 97  CO2 27 26  GLUCOSE 106* 96  BUN 24* 32*  CREATININE 0.88 0.94  CALCIUM 10.6* 10.4  MG -- --  PHOS -- --   Liver Function Tests:  [facility] 06/21/12 0625 06/20/12 0946  AST 28 34  ALT 17 20  ALKPHOS 76 78  BILITOT 0.6 0.5  PROT 7.0 7.4  ALBUMIN 3.8 3.9     CBC:  Basename 06/21/12 0625 06/20/12 0946  WBC 9.5 6.0  NEUTROABS -- --  HGB 13.6 13.6  HCT 39.9 39.3  MCV 92.8 92.5  PLT 222 223    Dg Chest Portable 1 View  06/20/2012  *RADIOLOGY REPORT*  Clinical Data: Short of breath.  Chest pain.  COPD.  PORTABLE CHEST - 1 VIEW  Comparison: 06/15/2012  Findings: Pulmonary emphysema is again demonstrated with bilateral upper lobe and bibasilar scarring.  No evidence of acute infiltrate or edema.  No evidence of pleural effusion.  Heart size and mediastinal contours are within normal limits.  IMPRESSION: Severe pulmonary emphysema and bilateral parenchymal scarring.  No acute findings.  Original Report Authenticated By: Danae Orleans, M.D.      Medications:  Scheduled:   .  antiseptic oral rinse  15 mL Mouth Rinse BID  . calcium-vitamin D  1 tablet Oral BID  . fluticasone-salmeterol  1 puff Inhalation BID  . heparin  5,000 Units Subcutaneous Q8H  . Olopatadine HCl  1 drop Topical BID  . pantoprazole  40 mg Oral Q1200  . senna-docusate  1 tablet Oral BID  . tiotropium  18 mcg Inhalation Daily    Impression: 1. COPD, stable. 2. Recent cervical spine fracture, being treated conservatively with neck collar. 3. Osteoporosis. 4. Constipation.     Plan: 1. Senokot-S one twice a day. 2. Await physical therapy and occupational therapy evaluation.     LOS: 1 day   Wilson Singer Pager [phone]  06/21/2012, 10:43 AM

## 2012-06-21 NOTE — Progress Notes (Signed)
Notified MD that patient is constipated. Orders to follow

## 2012-06-22 ENCOUNTER — Telehealth: Payer: Self-pay | Admitting: Family Medicine

## 2012-06-22 MED ORDER — GUAIFENESIN 100 MG/5ML PO SOLN: 5.0000 mL | ORAL | Status: DC | PRN

## 2012-06-22 MED ORDER — ACETAMINOPHEN 325 MG PO TABS: 650.0000 mg | ORAL_TABLET | Freq: Four times a day (QID) | ORAL | Status: DC | PRN
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
## 2012-06-22 NOTE — Evaluation (Signed)
Physical Therapy Evaluation Patient Details Name: Christine Garza MRN: [id number] DOB: 01/22/1935 Today's Date: 06/22/2012 Time: 1191-4782 PT Time Calculation (min): 51 min  PT Assessment / Plan / Recommendation Clinical Impression  Pt seen for reeval after exacerbation of COPD.  She states that her respiratory distress may be related to anxiety, and she did have a high resting HR (120s).  Functionally, she is doing well, but c/o R anterior upper chest pain when coughing, feels that she must sleep in a recliner as she is not comfortable supine, and now needs a walker to ensure gait stability due to DOE and lack of cervical ROM.  She will need 24 hour assist whether at home or in ACLF.  Would also like a HHPT visit to ensure home safety and indpendence with walker at home.    PT Assessment  All further PT needs can be met in the next venue of care    Follow Up Recommendations  Home health PT    Barriers to Discharge        Equipment Recommendations  Other (comment) (Rollator walker (4 wheeled walker with hand brakes and seat))    Recommendations for Other Services     Frequency      Precautions / Restrictions Precautions Precautions: Cervical;Fall Required Braces or Orthoses: Cervical Brace Cervical Brace: Hard collar;Applied in sitting position Restrictions Weight Bearing Restrictions: No   Pertinent Vitals/Pain       Mobility  Bed Mobility Bed Mobility: Not assessed Details for Bed Mobility Assistance: Pt states that she cannot lie flat in bed...has to be sittiing upright...plans to sleep in a recliner chair Transfers Transfers: Sit to Stand Sit to Stand: 6: Modified independent (Device/Increase time);With upper extremity assist;With armrests Stand to Sit: 6: Modified independent (Device/Increase time);With upper extremity assist;To chair/3-in-1 Ambulation/Gait Ambulation/Gait Assistance: 5: Supervision Ambulation Distance (Feet): 80 Feet Assistive device: Rolling  walker Ambulation/Gait Assistance Details: pt is able to ambulate with no assistive device, but walker reinforces her security due to no cervical ROM.  It also helps to decrease enery expenditure with DOE. Gait Pattern: Within Functional Limits Stairs: No Wheelchair Mobility Wheelchair Mobility: No    Exercises     PT Diagnosis: Difficulty walking;Generalized weakness  PT Problem List: Decreased activity tolerance;Decreased mobility;Pain PT Treatment Interventions:     PT Goals    Visit Information  Last PT Received On: 06/22/12    Subjective Data  Subjective: Wants to go home Patient Stated Goal: Home I   Prior Functioning       Cognition  Overall Cognitive Status: Appears within functional limits for tasks assessed/performed Arousal/Alertness: Awake/alert Orientation Level: Appears intact for tasks assessed Behavior During Session: [facility] for tasks performed    Extremity/Trunk Assessment Right Upper Extremity Assessment RUE ROM/Strength/Tone: Within functional levels RUE Sensation: WFL - Light Touch RUE Coordination: WFL - gross motor Left Upper Extremity Assessment LUE ROM/Strength/Tone: Within functional levels LUE Sensation: WFL - Light Touch LUE Coordination: WFL - gross motor Right Lower Extremity Assessment RLE ROM/Strength/Tone: Within functional levels RLE Sensation: History of peripheral neuropathy;WFL - Light Touch RLE Coordination: WFL - gross motor Left Lower Extremity Assessment LLE ROM/Strength/Tone: Within functional levels LLE Sensation: History of peripheral neuropathy;WFL - Light Touch LLE Coordination: WFL - gross motor Trunk Assessment Trunk Assessment: Normal   Balance Balance Balance Assessed: Yes Dynamic Standing Balance Dynamic Standing - Level of Assistance: 5: Stand by assistance High Level Balance High Level Balance Activites: Backward walking High Level Balance Comments: slight unsteadiness with backward walking,  unable to stand on  toes  End of Session PT - End of Session Equipment Utilized During Treatment: Gait belt Activity Tolerance: Patient tolerated treatment well (HR is elevated at rest...in 120s) Patient left: in chair;with call bell/phone within reach Nurse Communication: Mobility status  GP     Konrad Penta 06/22/2012, 9:38 AM
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
## 2012-06-22 NOTE — Progress Notes (Signed)
Patient states she had a large BM last night following an enema.  States relief.  Will continue to monitor.  Schonewitz, Candelaria Stagers 06/22/2012
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
## 2012-06-22 NOTE — Progress Notes (Signed)
D/c instructions reviewed with patient.  Verbalized understanding.  Pt dc'd to home with family. Schonewitz, Candelaria Stagers 06/22/2012

## 2012-06-22 NOTE — Progress Notes (Signed)
Subjective: This lady seems to be back to her baseline in terms of her COPD. She is now having trouble with extreme constipation. Physical therapy has recommended home health physical therapy and 24 hour assistance. She is not a candidate for skilled nursing facility. Unfortunately, assisted-living facilities so far feel that she does not qualify for level of care that they would give. The option only seems to be to go home but with support.           Physical Exam: Blood pressure 112/69, pulse 102, temperature 98.3 F (36.8 C), temperature source Oral, resp. rate 17, height 5\' 2"  (1.575 m), weight 42.638 kg (94 lb), SpO2 95.00%. There is no increased work of breathing. Lung fields are clear with poor air entry as before. There are no crackles or wheezes. Heart sounds are present and normal without murmurs. She is alert and orientated.   Investigations:  Recent Results (from the past 240 hour(s))  MRSA PCR SCREENING     Status: Normal   Collection Time   06/15/12 10:06 PM      Component Value Range Status Comment   MRSA by PCR NEGATIVE  NEGATIVE Final      Basic Metabolic Panel:  Basename 06/21/12 0625 06/20/12 0946  NA 136 136  K 4.1 4.1  CL 96 97  CO2 27 26  GLUCOSE 106* 96  BUN 24* 32*  CREATININE 0.88 0.94  CALCIUM 10.6* 10.4  MG -- --  PHOS -- --   Liver Function Tests:  [facility] 06/21/12 0625 06/20/12 0946  AST 28 34  ALT 17 20  ALKPHOS 76 78  BILITOT 0.6 0.5  PROT 7.0 7.4  ALBUMIN 3.8 3.9     CBC:  Basename 06/21/12 0625 06/20/12 0946  WBC 9.5 6.0  NEUTROABS -- --  HGB 13.6 13.6  HCT 39.9 39.3  MCV 92.8 92.5  PLT 222 223    No results found.    Medications:  Scheduled:    . antiseptic oral rinse  15 mL Mouth Rinse BID  . calcium-vitamin D  1 tablet Oral BID  . fluticasone-salmeterol  1 puff Inhalation BID  . heparin  5,000 Units Subcutaneous Q8H  . Olopatadine HCl  1 drop Topical BID  . pantoprazole  40 mg Oral Q1200  .  senna-docusate  1 tablet Oral BID  . tiotropium  18 mcg Inhalation Daily    Impression: 1. COPD, stable. 2. Recent cervical spine fracture, being treated conservatively with neck collar. 3. Osteoporosis. 4. Constipation.     Plan: 1. Await further disposition decisions. Patient is medically stable at this point.     LOS: 2 days   Wilson Singer Pager [phone]  06/22/2012, 12:07 PM

## 2012-06-22 NOTE — Clinical Social Work Note (Signed)
CSW followed up with pt. Pt states she wants to return home.  CSW discussed ALF option and called 2 facilities.  However, both of these facilities said pt did not have enough ADL needs to qualify due to new Medicaid requirements. Per RN, pt is independent with some assist for dressing. Pt notified and CM also met with pt.  She will have full home health services and grandson is off today to transport pt and help her settle in at home today.  Pt has a neighbor who has volunteered to assist pt at home. CSW will sign off.  Derenda Fennel, [place] [phone]

## 2012-06-22 NOTE — Progress Notes (Signed)
UR Chart Review Completed  

## 2012-06-23 ENCOUNTER — Telehealth: Payer: Self-pay | Admitting: Family Medicine

## 2012-06-23 MED ORDER — FUROSEMIDE 20 MG PO TABS: 20.0000 mg | ORAL_TABLET | Freq: Every day | ORAL | Status: DC

## 2012-06-23 NOTE — Telephone Encounter (Signed)
Do you want to see her in the office?  

## 2012-06-23 NOTE — Telephone Encounter (Signed)
Will send lasix for 3 days, elevated feet, take before 6pm, if she gets SOB or has chest pain she needs to go to ER or come in for visit

## 2012-06-23 NOTE — Telephone Encounter (Signed)
Pt aware.

## 2012-06-24 ENCOUNTER — Other Ambulatory Visit: Payer: Self-pay | Admitting: Family Medicine

## 2012-06-25 ENCOUNTER — Telehealth: Payer: Self-pay | Admitting: Family Medicine

## 2012-06-25 NOTE — Telephone Encounter (Signed)
Spoke with pt and she thinks that she may have a pressure ulcer from staying in her recliner the majority of the day.  Christine Garza from [facility] is at her home evaluating her now.  She will call back with her recommendations and findings after the visit.

## 2012-06-25 NOTE — Telephone Encounter (Signed)
Please call pt and see what is going on, what kind of sore she has??
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
## 2012-06-25 NOTE — Telephone Encounter (Signed)
Left a message with HHRN, Mepiliex border to be ordered

## 2012-06-25 NOTE — Telephone Encounter (Signed)
Spoke with [facility] RN, she has a few small blisters, no erythema surrounding, she was initially concerned about shingles but states they do not look like classic blisters, pt unable to come in for appt We will use gel foam and dressing to keep from rubbing and infection and she will recheck Pt will call if these spread and are indeed HSV

## 2012-06-30 ENCOUNTER — Telehealth: Payer: Self-pay | Admitting: Family Medicine

## 2012-06-30 MED ORDER — FUROSEMIDE 20 MG PO TABS: 20.0000 mg | ORAL_TABLET | Freq: Every day | ORAL | Status: DC

## 2012-06-30 NOTE — Telephone Encounter (Signed)
She is to restart the lasix once a day , please have her try to schedule an appt within the next 2 weeks

## 2012-06-30 NOTE — Telephone Encounter (Signed)
Started swelling during the night and she has been keeping them elevated throughout the day but they are still swelling. Wants to know if something can be sent in since she cannot come here

## 2012-07-01 ENCOUNTER — Telehealth: Payer: Self-pay

## 2012-07-01 DIAGNOSIS — J441 Chronic obstructive pulmonary disease with (acute) exacerbation: Secondary | ICD-10-CM

## 2012-07-01 DIAGNOSIS — IMO0002 Reserved for concepts with insufficient information to code with codable children: Secondary | ICD-10-CM

## 2012-07-01 DIAGNOSIS — J438 Other emphysema: Secondary | ICD-10-CM

## 2012-07-01 DIAGNOSIS — M810 Age-related osteoporosis without current pathological fracture: Secondary | ICD-10-CM

## 2012-07-01 NOTE — Telephone Encounter (Signed)
Patient is aware 

## 2012-07-01 NOTE — Telephone Encounter (Signed)
[facility] from [facility] called and said she keeps complaining about the neck brace hurting her and PJ doesn't know what else to do. PT have been out and put some new padding on it but she wants them to come out again to check it. She does NOT have a follow up with an ortho regarding this and she has been telling her she needs to come in here asap. PJ wants to know if they can take the brace off or if you need to order an xray first or what you wanted them to do about this. They are confused since they don't have any orders

## 2012-07-02 ENCOUNTER — Telehealth: Payer: Self-pay | Admitting: Family Medicine

## 2012-07-02 NOTE — Telephone Encounter (Signed)
I spoke with neurosurgery, they have spoken with pt and son, appt has been set up for August 8th. They will also speak with her about the brace, the nurse had some questions for Dr. Wynetta Emery on his instructions and type of brace, I will defer to his office regarding this

## 2012-07-02 NOTE — Telephone Encounter (Signed)
Noted  

## 2012-07-02 NOTE — Telephone Encounter (Signed)
When I spoke with pt after discharge she told me she had appt with neurosurgery, Now I am getting calls about her neck brace and she does not have an appt that she is aware of, reviewed hospital records seen by Dr. Donalee Citrin and 2 week follow-up was to be arranged. Pt states she does not know when or where she was to follow-up. I will try to arrange this

## 2012-07-03 ENCOUNTER — Telehealth: Payer: Self-pay | Admitting: Family Medicine

## 2012-07-03 ENCOUNTER — Emergency Department (HOSPITAL_COMMUNITY): Payer: Medicare Other

## 2012-07-03 ENCOUNTER — Encounter (HOSPITAL_COMMUNITY): Payer: Self-pay | Admitting: Emergency Medicine

## 2012-07-03 ENCOUNTER — Other Ambulatory Visit: Payer: Self-pay

## 2012-07-03 ENCOUNTER — Emergency Department (HOSPITAL_COMMUNITY)
Admission: EM | Admit: 2012-07-03 | Discharge: 2012-07-03 | Disposition: A | Discharge status: Home / Self Care | Payer: Medicare Other | Attending: Emergency Medicine | Admitting: Emergency Medicine

## 2012-07-03 DIAGNOSIS — F329 Major depressive disorder, single episode, unspecified: Secondary | ICD-10-CM | POA: Insufficient documentation

## 2012-07-03 DIAGNOSIS — M51379 Other intervertebral disc degeneration, lumbosacral region without mention of lumbar back pain or lower extremity pain: Secondary | ICD-10-CM | POA: Insufficient documentation

## 2012-07-03 DIAGNOSIS — F3289 Other specified depressive episodes: Secondary | ICD-10-CM | POA: Insufficient documentation

## 2012-07-03 DIAGNOSIS — J441 Chronic obstructive pulmonary disease with (acute) exacerbation: Secondary | ICD-10-CM | POA: Insufficient documentation

## 2012-07-03 DIAGNOSIS — M810 Age-related osteoporosis without current pathological fracture: Secondary | ICD-10-CM | POA: Insufficient documentation

## 2012-07-03 DIAGNOSIS — Z79899 Other long term (current) drug therapy: Secondary | ICD-10-CM | POA: Insufficient documentation

## 2012-07-03 DIAGNOSIS — M5137 Other intervertebral disc degeneration, lumbosacral region: Secondary | ICD-10-CM | POA: Insufficient documentation

## 2012-07-03 LAB — COMPREHENSIVE METABOLIC PANEL
ALT: 10 U/L (ref 0–35)
AST: 22 U/L (ref 0–37)
CO2: 31 mEq/L (ref 19–32)
Chloride: 96 mEq/L (ref 96–112)
GFR calc non Af Amer: 56 mL/min — ABNORMAL LOW (ref >90)
Potassium: 3.7 mEq/L (ref 3.5–5.1)
Sodium: 136 mEq/L (ref 135–145)
Total Bilirubin: 0.2 mg/dL — ABNORMAL LOW (ref 0.3–1.2)

## 2012-07-03 LAB — CBC WITH DIFFERENTIAL/PLATELET
Basophils Absolute: 0.1 10*3/uL (ref 0.0–0.1)
HCT: 39.3 % (ref 36.0–46.0)
Lymphocytes Relative: 26 % (ref 12–46)
Neutro Abs: 4.2 10*3/uL (ref 1.7–7.7)
Neutrophils Relative %: 64 % (ref 43–77)
Platelets: 305 10*3/uL (ref 150–400)
RDW: 13 % (ref 11.5–15.5)
WBC: 6.6 10*3/uL (ref 4.0–10.5)

## 2012-07-03 MED ORDER — PREDNISONE 10 MG PO TABS: 20.0000 mg | ORAL_TABLET | Freq: Two times a day (BID) | ORAL | Status: DC

## 2012-07-03 MED ORDER — IPRATROPIUM BROMIDE 0.02 % IN SOLN
0.5000 mg | Freq: Once | RESPIRATORY_TRACT | Status: AC
  Administered 2012-07-03: 0.5 mg via RESPIRATORY_TRACT
  Filled 2012-07-03: qty 2.5

## 2012-07-03 MED ORDER — METHYLPREDNISOLONE SODIUM SUCC 125 MG IJ SOLR
125.0000 mg | Freq: Once | INTRAMUSCULAR | Status: AC
  Administered 2012-07-03: 125 mg via INTRAVENOUS
  Filled 2012-07-03: qty 2

## 2012-07-03 MED ORDER — ALBUTEROL SULFATE (5 MG/ML) 0.5% IN NEBU
5.0000 mg | INHALATION_SOLUTION | Freq: Once | RESPIRATORY_TRACT | Status: AC
  Administered 2012-07-03: 5 mg via RESPIRATORY_TRACT
  Filled 2012-07-03: qty 1

## 2012-07-03 NOTE — ED Notes (Signed)
Pt resting quietly, no acute distress.

## 2012-07-03 NOTE — ED Notes (Signed)
States she started feeling short of breath about 30 minutes ago.  EMS admin albuterol treatment x1 with relief of shortness of breath, EMS states o2 sat was 89 percent on room air.
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
## 2012-07-03 NOTE — Telephone Encounter (Signed)
Called and spoke with patient.  She had no particular concerns.  Just wanted to talk with someone.

## 2012-07-03 NOTE — ED Notes (Signed)
States that she started feeling short of breath and called 911 to come to the hospital.  States it has happened in the past and she has a history of COPD.  States she tried breathing treatments earlier in the day and started feeling worse.

## 2012-07-03 NOTE — ED Notes (Signed)
No acute distress noted.

## 2012-07-09 ENCOUNTER — Telehealth: Payer: Self-pay | Admitting: Family Medicine

## 2012-07-09 NOTE — Telephone Encounter (Signed)
Does she need to come in for this first?

## 2012-07-09 NOTE — Telephone Encounter (Signed)
I reviewed ED note and script, seen for COPD exacerbation appear she was given high dose 20mg  BID for 5 days , spoke with pt, states they give her energy and wanted more, advised they are to be used short term as needed, she will complete the prescription she has now, no further prednisone

## 2012-07-13 ENCOUNTER — Telehealth: Payer: Self-pay | Admitting: Family Medicine

## 2012-07-13 NOTE — Telephone Encounter (Signed)
noted 

## 2012-07-13 NOTE — Telephone Encounter (Signed)
FYI-- Noted. Will make Dr aware

## 2012-07-15 ENCOUNTER — Telehealth: Payer: Self-pay | Admitting: Family Medicine

## 2012-07-15 NOTE — Telephone Encounter (Signed)
Pt aware it has been sent in

## 2012-07-17 ENCOUNTER — Telehealth: Payer: Self-pay | Admitting: Family Medicine

## 2012-07-17 NOTE — Telephone Encounter (Signed)
noted 

## 2012-07-30 ENCOUNTER — Telehealth: Payer: Self-pay | Admitting: *Deleted

## 2012-07-30 NOTE — Telephone Encounter (Signed)
Labs drawn for fasting and pt does need to obtain a urine @ her next appt with Dr Constance Goltz as this was not done today.  She has h/o hematuria

## 2012-08-18 ENCOUNTER — Other Ambulatory Visit: Payer: Self-pay | Admitting: Family Medicine

## 2012-08-19 ENCOUNTER — Other Ambulatory Visit: Payer: Self-pay | Admitting: Neurosurgery

## 2012-08-19 DIAGNOSIS — IMO0002 Reserved for concepts with insufficient information to code with codable children: Secondary | ICD-10-CM

## 2012-09-08 ENCOUNTER — Other Ambulatory Visit: Payer: Medicare Other

## 2012-09-21 ENCOUNTER — Other Ambulatory Visit: Payer: Self-pay | Admitting: Family Medicine

## 2012-10-05 ENCOUNTER — Telehealth: Payer: Self-pay | Admitting: Family Medicine

## 2012-10-06 ENCOUNTER — Ambulatory Visit (HOSPITAL_COMMUNITY)
Admission: RE | Admit: 2012-10-06 | Discharge: 2012-10-06 | Disposition: A | Discharge status: Home / Self Care | Payer: Medicare Other | Source: Ambulatory Visit | Attending: Neurosurgery | Admitting: Neurosurgery

## 2012-10-06 DIAGNOSIS — IMO0002 Reserved for concepts with insufficient information to code with codable children: Secondary | ICD-10-CM

## 2012-10-06 DIAGNOSIS — Z4789 Encounter for other orthopedic aftercare: Secondary | ICD-10-CM | POA: Insufficient documentation

## 2012-10-09 NOTE — Telephone Encounter (Signed)
Does she qualify?

## 2013-01-05 ENCOUNTER — Encounter: Payer: Medicare Other | Admitting: Family Medicine

## 2013-02-28 ENCOUNTER — Other Ambulatory Visit: Payer: Self-pay | Admitting: Family Medicine

## 2013-03-02 ENCOUNTER — Telehealth: Payer: Self-pay | Admitting: Family Medicine

## 2013-03-11 NOTE — Telephone Encounter (Signed)
Needs refill of nasal spray. None on medlist

## 2013-03-12 MED ORDER — AZELASTINE HCL 0.1 % NA SOLN: 1.0000 | Freq: Two times a day (BID) | NASAL | Status: DC

## 2013-03-12 NOTE — Telephone Encounter (Signed)
Astelin sent

## 2013-03-12 NOTE — Telephone Encounter (Signed)
You will have to call her or pharmacy to see what spray, I have not sent anything for her recently.

## 2013-03-12 NOTE — Telephone Encounter (Signed)
azsteline

## 2013-03-13 ENCOUNTER — Encounter (HOSPITAL_COMMUNITY): Payer: Self-pay

## 2013-03-13 ENCOUNTER — Emergency Department (HOSPITAL_COMMUNITY): Payer: Medicare Other

## 2013-03-13 ENCOUNTER — Emergency Department (HOSPITAL_COMMUNITY)
Admission: EM | Admit: 2013-03-13 | Discharge: 2013-03-13 | Disposition: A | Discharge status: Home / Self Care | Payer: Medicare Other | Attending: Emergency Medicine | Admitting: Emergency Medicine

## 2013-03-13 DIAGNOSIS — Z87891 Personal history of nicotine dependence: Secondary | ICD-10-CM | POA: Insufficient documentation

## 2013-03-13 DIAGNOSIS — Z8619 Personal history of other infectious and parasitic diseases: Secondary | ICD-10-CM | POA: Insufficient documentation

## 2013-03-13 DIAGNOSIS — Z8719 Personal history of other diseases of the digestive system: Secondary | ICD-10-CM | POA: Insufficient documentation

## 2013-03-13 DIAGNOSIS — Z8709 Personal history of other diseases of the respiratory system: Secondary | ICD-10-CM | POA: Insufficient documentation

## 2013-03-13 DIAGNOSIS — Z79899 Other long term (current) drug therapy: Secondary | ICD-10-CM | POA: Insufficient documentation

## 2013-03-13 DIAGNOSIS — M545 Low back pain, unspecified: Secondary | ICD-10-CM | POA: Insufficient documentation

## 2013-03-13 DIAGNOSIS — Z8739 Personal history of other diseases of the musculoskeletal system and connective tissue: Secondary | ICD-10-CM | POA: Insufficient documentation

## 2013-03-13 DIAGNOSIS — Z8659 Personal history of other mental and behavioral disorders: Secondary | ICD-10-CM | POA: Insufficient documentation

## 2013-03-13 DIAGNOSIS — Z8669 Personal history of other diseases of the nervous system and sense organs: Secondary | ICD-10-CM | POA: Insufficient documentation

## 2013-03-13 DIAGNOSIS — M810 Age-related osteoporosis without current pathological fracture: Secondary | ICD-10-CM | POA: Insufficient documentation

## 2013-03-13 DIAGNOSIS — IMO0002 Reserved for concepts with insufficient information to code with codable children: Secondary | ICD-10-CM | POA: Insufficient documentation

## 2013-03-13 LAB — CBC WITH DIFFERENTIAL/PLATELET
Basophils Absolute: 0.1 10*3/uL (ref 0.0–0.1)
Eosinophils Absolute: 0.1 10*3/uL (ref 0.0–0.7)
Eosinophils Relative: 1 % (ref 0–5)
MCH: 31.6 pg (ref 26.0–34.0)
MCV: 90.9 fL (ref 78.0–100.0)
Platelets: 210 10*3/uL (ref 150–400)
RDW: 12.9 % (ref 11.5–15.5)
WBC: 6.7 10*3/uL (ref 4.0–10.5)

## 2013-03-13 LAB — URINALYSIS, ROUTINE W REFLEX MICROSCOPIC
Bilirubin Urine: NEGATIVE
Hgb urine dipstick: NEGATIVE
Nitrite: NEGATIVE
Specific Gravity, Urine: <1.005 — ABNORMAL LOW (ref 1.005–1.030)
pH: 6 (ref 5.0–8.0)

## 2013-03-13 LAB — COMPREHENSIVE METABOLIC PANEL
ALT: 13 U/L (ref 0–35)
AST: 23 U/L (ref 0–37)
Calcium: 9.8 mg/dL (ref 8.4–10.5)
Sodium: 133 mEq/L — ABNORMAL LOW (ref 135–145)
Total Protein: 6.9 g/dL (ref 6.0–8.3)

## 2013-03-13 MED ORDER — OXYCODONE-ACETAMINOPHEN 5-325 MG PO TABS: 1.0000 | ORAL_TABLET | ORAL | Status: DC | PRN

## 2013-03-13 MED ORDER — OXYCODONE-ACETAMINOPHEN 5-325 MG PO TABS
1.0000 | ORAL_TABLET | Freq: Once | ORAL | Status: AC
  Administered 2013-03-13: 1 via ORAL
  Filled 2013-03-13: qty 1

## 2013-03-13 NOTE — ED Notes (Signed)
Pt presents with left rib pain x 24 hrs. Pt states "got in friends Zenaida Niece yesterday and has been hurting since, feel a knot on left side that spasms '. Denies SOB, and UTI symptoms. NAD noted at this time.

## 2013-03-13 NOTE — ED Notes (Signed)
Pt reports left flank pain that started yesterday and is worse today, denies any n/v/d, no urinary s/s, no fever. Pain comes and goes.

## 2013-03-13 NOTE — ED Provider Notes (Signed)
History    This chart was scribed for Christine Cooper III, MD by Charolett Bumpers, ED Scribe. The patient was seen in room APA01/APA01. Patient's care was started at 11:46.   CSN: [id number]  Arrival date & time 03/13/13  1030   First MD Initiated Contact with Patient 03/13/13 1146      Chief Complaint  Patient presents with  . Flank Pain   The history is provided by the patient. No language interpreter was used.   Christine Garza is a 78 y.o. female who presents to the Emergency Department complaining of intermittent left flank pain. She had some mild pain yesterday morning that resolved but when she woke up this morning her pain was much more severe and had trouble getting out of bed. She states that currently her pain is 3/10 but if she moves, her pain is 10/10. She states that she noticed an area of swelling "knot" that has since improved. She denies any nausea, vomiting, diarrhea, urinary symptoms or fever. She states that she is normally otherwise healthy. She has a h/o emphysema, GERD and lumbar DDD and denies any surgical hx. She is on 2 L O2 at home as needed.   PCP: Dr. Jeanice Lim   Past Medical History  Diagnosis Date  . Allergy   . Emphysema of lung   . Osteoporosis   . GERD (gastroesophageal reflux disease)   . Pulmonary nodules/lesions, multiple     2 nodules on left side- most recent in 2012 LLL  . HSV-1 (herpes simplex virus 1) infection     Valtrex prn Cold sores  . Depression   . Cataract     Dr. Gardenia Phlegm [place]  . Dry eye syndrome   . HPV (human papilloma virus) infection     Dr. Despina Hidden s/p lasar ablation  . DDD (degenerative disc disease), lumbar     Past Surgical History  Procedure Laterality Date  . Eye surgery    . Hpv laser removal    . Laser ablation of the cervix      Dr.Eure    Family History  Problem Relation Age of Onset  . Cancer Mother     liver    History  Substance Use Topics  . Smoking status: Former Smoker    Quit date:  05/09/1996  . Smokeless tobacco: Never Used  . Alcohol Use: No    OB History   Grav Para Term Preterm Abortions TAB SAB Ect Mult Living   1 1 1       1       Review of Systems  Constitutional: Negative for fever.  Gastrointestinal: Negative for nausea, vomiting and diarrhea.  Genitourinary: Positive for flank pain. Negative for hematuria and difficulty urinating.  All other systems reviewed and are negative.    Allergies  Baclofen; Hydromorphone hcl; Penicillins; and Cefpodoxime proxetil  Home Medications   Current Outpatient Rx  Name  Route  Sig  Dispense  Refill  . albuterol (PROAIR HFA) 108 (90 BASE) MCG/ACT inhaler   Inhalation   Inhale 1 puff into the lungs every 4 (four) hours as needed. For shortness of breath         . albuterol (PROVENTIL) (2.5 MG/3ML) 0.083% nebulizer solution   Nebulization   Take 2.5 mg by nebulization every 6 (six) hours as needed. For shortness of breath         . alendronate (FOSAMAX) 70 MG tablet   Oral   Take 70 mg by mouth every  7 (seven) days. Takes Friday         . azelastine (ASTELIN) 137 MCG/SPRAY nasal spray   Nasal   Place 1 spray into the nose 2 (two) times daily. Use in each nostril as directed   30 mL   3   . Calcium Carbonate-Vitamin D (CALCIUM 600+D PO)   Oral   Take 2 tablets by mouth 2 (two) times daily.          [name] DEXILANT 60 MG capsule      TAKE 1 CAPSULE BY MOUTH EVERY DAY   30 capsule   3   . fluticasone-salmeterol (ADVAIR HFA) 230-21 MCG/ACT inhaler   Inhalation   Inhale 1 puff into the lungs 2 (two) times daily.           . Olopatadine HCl (PATADAY) 0.2 % SOLN      [place] AS NEEDED FOR ITCHY EYES   2.5 mL   3   . tiotropium (SPIRIVA) 18 MCG inhalation capsule   Inhalation   Place 18 mcg into inhaler and inhale daily.             Triage Vitals: BP 136/77  Pulse 98  Temp(Src) 97.8 F (36.6 C) (Oral)  Resp 20  Ht 5\' 2"  (1.575 m)  Wt 98 lb (44.453 kg)  BMI 17.92 kg/m2  SpO2  100%  Physical Exam  Nursing note and vitals reviewed. Constitutional: She is oriented to person, place, and time. She appears well-developed and well-nourished. No distress.  HENT:  Head: Normocephalic and atraumatic.  Right Ear: Tympanic membrane, external ear and ear canal normal.  Left Ear: Tympanic membrane, external ear and ear canal normal.  Nose: Nose normal.  Mouth/Throat: Oropharynx is clear and moist. No oropharyngeal exudate.  Eyes: Conjunctivae and EOM are normal. Pupils are equal, round, and reactive to light.  Neck: Normal range of motion. Neck supple. No tracheal deviation present.  Cardiovascular: Normal rate, regular rhythm and normal heart sounds.   Pulmonary/Chest: Effort normal and breath sounds normal. No respiratory distress.  Abdominal: Soft. Bowel sounds are normal. She exhibits no distension. There is no tenderness.  Musculoskeletal: Normal range of motion. She exhibits tenderness. She exhibits no edema.  Tenderness is localized to the left posterior chest over the 11th and 12th ribs. No bony deformities or crepitus. No subcutaneous emphysema noted. Neurovascularly intact.   Lymphadenopathy:    She has no cervical adenopathy.  Neurological: She is alert and oriented to person, place, and time.  Skin: Skin is warm and dry.  Psychiatric: She has a normal mood and affect. Her behavior is normal.    ED Course  Procedures (including critical care time)  DIAGNOSTIC STUDIES: Oxygen Saturation is 100% on room air, normal by my interpretation.    COORDINATION OF CARE:  12:25 PM-Discussed planned course of treatment with the pt, including a CT scan of her abdomen, blood work and UA who is agreeable at this time. Asked pt if she needed pain medication, she declined at this time.   12:51 PM-Per nurse, pt got up to go to the restroom and is now complaining of increased pain. Will prescribe pain medication.   Results for orders placed during the hospital encounter of  03/13/13  CBC WITH DIFFERENTIAL      Result Value Range   WBC 6.7  4.0 - 10.5 K/uL   RBC 4.53  3.87 - 5.11 MIL/uL   Hemoglobin 14.3  12.0 - 15.0 g/dL   HCT 16.1  09.6 -  46.0 %   MCV 90.9  78.0 - 100.0 fL   MCH 31.6  26.0 - 34.0 pg   MCHC 34.7  30.0 - 36.0 g/dL   RDW 47.8  29.5 - 62.1 %   Platelets 210  150 - 400 K/uL   Neutrophils Relative 72  43 - 77 %   Neutro Abs 4.8  1.7 - 7.7 K/uL   Lymphocytes Relative 20  12 - 46 %   Lymphs Abs 1.3  0.7 - 4.0 K/uL   Monocytes Relative 6  3 - 12 %   Monocytes Absolute 0.4  0.1 - 1.0 K/uL   Eosinophils Relative 1  0 - 5 %   Eosinophils Absolute 0.1  0.0 - 0.7 K/uL   Basophils Relative 1  0 - 1 %   Basophils Absolute 0.1  0.0 - 0.1 K/uL  COMPREHENSIVE METABOLIC PANEL      Result Value Range   Sodium 133 (*) 135 - 145 mEq/L   Potassium 4.3  3.5 - 5.1 mEq/L   Chloride 95 (*) 96 - 112 mEq/L   CO2 28  19 - 32 mEq/L   Glucose, Bld 93  70 - 99 mg/dL   BUN 23  6 - 23 mg/dL   Creatinine, Ser 3.08  0.50 - 1.10 mg/dL   Calcium 9.8  8.4 - 65.7 mg/dL   Total Protein 6.9  6.0 - 8.3 g/dL   Albumin 4.1  3.5 - 5.2 g/dL   AST 23  0 - 37 U/L   ALT 13  0 - 35 U/L   Alkaline Phosphatase 94  39 - 117 U/L   Total Bilirubin 0.3  0.3 - 1.2 mg/dL   GFR calc non Af Amer 48 (*) >90 mL/min   GFR calc Af Amer 55 (*) >90 mL/min  URINALYSIS, ROUTINE W REFLEX MICROSCOPIC      Result Value Range   Color, Urine YELLOW  YELLOW   APPearance CLEAR  CLEAR   Specific Gravity, Urine <1.005 (*) 1.005 - 1.030   pH 6.0  5.0 - 8.0   Glucose, UA NEGATIVE  NEGATIVE mg/dL   Hgb urine dipstick NEGATIVE  NEGATIVE   Bilirubin Urine NEGATIVE  NEGATIVE   Ketones, ur NEGATIVE  NEGATIVE mg/dL   Protein, ur NEGATIVE  NEGATIVE mg/dL   Urobilinogen, UA 0.2  0.0 - 1.0 mg/dL   Nitrite NEGATIVE  NEGATIVE   Leukocytes, UA NEGATIVE  NEGATIVE    Ct Abdomen Pelvis Wo Contrast  03/13/2013  *RADIOLOGY REPORT*  Clinical Data: left flank pain  CT ABDOMEN AND PELVIS WITHOUT CONTRAST   Technique:  Multidetector CT imaging of the abdomen and pelvis was performed following the standard protocol without intravenous contrast.  Comparison: 04/14/2011  Findings: There are changes of centrilobular and paraseptal emphysema.  There is a nodule in the left base which measures 7 mm, image number eight/series 3.  This is unchanged from previous exam.  No focal liver abnormality.  The gallbladder is normal.  No biliary dilatation.  Normal appearance of the pancreas.  The spleen is unremarkable.  Both adrenal glands are normal.  Hyperattenuating cyst with in the mid pole of the right kidney measures 1.8 cm.  This is increased from 1.3 cm previously.  No right-sided nephrolithiasis or hydronephrosis.  There are no left renal calculi or hydronephrosis noted.  The urinary bladder appears normal.  The uterus and the adnexal structures are unremarkable for patient's age.  The stomach and the small bowel loops are unremarkable.  Normal appearance of the proximal colon.  Scattered distal colonic diverticula noted without acute inflammation.  No free fluid or fluid collections identified within the upper abdomen or the pelvis.  Review of the visualized osseous structures is significant for osteopenia and lumbar spondylosis.  There are bilateral L5 pars defects present.  Anterolisthesis of L5 on S1 measures 1 cm.  IMPRESSION:  1.  No nephrolithiasis or obstructive uropathy. 2.  Slight increase in size of hyperattenuating cyst within the upper pole the right kidney.  This is incompletely characterized without IV contrast. 3.  Lumbar spondylosis with anterolisthesis of L5 on S1.   Original Report Authenticated By: Signa Kell, M.D.     2:27 PM-Recheck: Informed pt of imaging and lab results. She reports improvement of her pain with the Percocet given in ED but worsened again when she got her CT scan. Pt states that she wants to go home. Will d/c home with pain medication and rest.     IMP:  Low back pain due to  lumbar strain  I personally performed the services described in this documentation, which was scribed in my presence. The recorded information has been reviewed and is accurate.  Osvaldo Human, MD     Christine Cooper III, MD 03/13/13 [phone]

## 2013-03-13 NOTE — ED Notes (Signed)
Pt c/o increased pain after ambulating to restroom. Steady gait noted. EDP notified of pain.

## 2013-03-14 ENCOUNTER — Encounter (HOSPITAL_COMMUNITY): Payer: Self-pay

## 2013-03-14 ENCOUNTER — Emergency Department (HOSPITAL_COMMUNITY)
Admission: EM | Admit: 2013-03-14 | Discharge: 2013-03-14 | Disposition: A | Discharge status: Home / Self Care | Payer: Medicare Other | Attending: Emergency Medicine | Admitting: Emergency Medicine

## 2013-03-14 DIAGNOSIS — Z8659 Personal history of other mental and behavioral disorders: Secondary | ICD-10-CM | POA: Insufficient documentation

## 2013-03-14 DIAGNOSIS — Z8709 Personal history of other diseases of the respiratory system: Secondary | ICD-10-CM | POA: Insufficient documentation

## 2013-03-14 DIAGNOSIS — Z79899 Other long term (current) drug therapy: Secondary | ICD-10-CM | POA: Insufficient documentation

## 2013-03-14 DIAGNOSIS — R1110 Vomiting, unspecified: Secondary | ICD-10-CM | POA: Insufficient documentation

## 2013-03-14 DIAGNOSIS — K219 Gastro-esophageal reflux disease without esophagitis: Secondary | ICD-10-CM | POA: Insufficient documentation

## 2013-03-14 DIAGNOSIS — Z8669 Personal history of other diseases of the nervous system and sense organs: Secondary | ICD-10-CM | POA: Insufficient documentation

## 2013-03-14 DIAGNOSIS — Z8739 Personal history of other diseases of the musculoskeletal system and connective tissue: Secondary | ICD-10-CM | POA: Insufficient documentation

## 2013-03-14 DIAGNOSIS — M810 Age-related osteoporosis without current pathological fracture: Secondary | ICD-10-CM | POA: Insufficient documentation

## 2013-03-14 DIAGNOSIS — M549 Dorsalgia, unspecified: Secondary | ICD-10-CM | POA: Insufficient documentation

## 2013-03-14 DIAGNOSIS — Z87891 Personal history of nicotine dependence: Secondary | ICD-10-CM | POA: Insufficient documentation

## 2013-03-14 DIAGNOSIS — Z8619 Personal history of other infectious and parasitic diseases: Secondary | ICD-10-CM | POA: Insufficient documentation

## 2013-03-14 MED ORDER — ONDANSETRON 4 MG PO TBDP
4.0000 mg | ORAL_TABLET | Freq: Once | ORAL | Status: AC
  Administered 2013-03-14: 4 mg via ORAL

## 2013-03-14 MED ORDER — ONDANSETRON HCL 4 MG/2ML IJ SOLN
4.0000 mg | Freq: Once | INTRAMUSCULAR | Status: AC
  Administered 2013-03-14: 4 mg via INTRAVENOUS
  Filled 2013-03-14: qty 2

## 2013-03-14 MED ORDER — HYDROCODONE-ACETAMINOPHEN 5-325 MG PO TABS: 1.0000 | ORAL_TABLET | Freq: Four times a day (QID) | ORAL | Status: DC | PRN

## 2013-03-14 MED ORDER — ONDANSETRON 4 MG PO TBDP: ORAL_TABLET | ORAL | Status: DC

## 2013-03-14 MED ORDER — ONDANSETRON 4 MG PO TBDP
ORAL_TABLET | ORAL | Status: AC
  Filled 2013-03-14: qty 1

## 2013-03-14 NOTE — ED Notes (Signed)
Patient complaining of increased nausea that started suddenly requesting PO pill before discharge. Dr. Estell Harpin stated to give ODT zofran 4 mg then patient is to be discharged home.

## 2013-03-14 NOTE — ED Notes (Signed)
Pt was seen in er yesterday and dx w/ back pain, was given oxycodone prescription, she has taken 1 at 2:15.  Started vomiting, around 2:45 pm,  Cont. To have back pain.  Denies any resp distress. Alert and oriented.

## 2013-03-14 NOTE — ED Notes (Signed)
Patient reports feeling somewhat better and states is ready to go home.

## 2013-03-14 NOTE — ED Provider Notes (Signed)
History  This chart was scribed for Christine Lennert, MD by Bennett Scrape, ED Scribe. This patient was seen in room APA19/APA19 and the patient's care was started at 6:50 PM.  CSN: [id number]  Arrival date & time 03/14/13  5784   First MD Initiated Contact with Patient 03/14/13 1850      Chief Complaint  Patient presents with  . Allergic Reaction  . Emesis     Patient is a 78 y.o. female presenting with vomiting. The history is provided by the patient. No language interpreter was used.  Emesis Severity:  Mild Duration:  5 hours Number of daily episodes:  1 Quality:  Stomach contents Able to tolerate:  Liquids and solids Progression:  Resolved Chronicity:  New Recent urination:  Normal Associated symptoms: no abdominal pain, no diarrhea and no headaches     Christine Garza is a 78 y.o. female who presents to the Emergency Department complaining of one episode of emesis due to a medication reaction that occurred 4 hours ago. Pt was seen in ED yesterday, was diagnosed with back pain and was given an oxycodone prescription. She reports taking one pill but then vomited 30 minutes afterwards. She has not taken another dose and is requesting a different pain medication. She still c/o left-sided back pain that is felt intermittently but denies diarrhea, cough, fever and urinary or bowel incontinence as associated symptoms. She has a h/o depression, DDD and GERD. She is a former smoker but denies alcohol use.   Past Medical History  Diagnosis Date  . Allergy   . Emphysema of lung   . Osteoporosis   . GERD (gastroesophageal reflux disease)   . Pulmonary nodules/lesions, multiple     2 nodules on left side- most recent in 2012 LLL  . HSV-1 (herpes simplex virus 1) infection     Valtrex prn Cold sores  . Depression   . Cataract     Dr. Gardenia Phlegm [place]  . Dry eye syndrome   . HPV (human papilloma virus) infection     Dr. Despina Hidden s/p lasar ablation  . DDD (degenerative disc  disease), lumbar     Past Surgical History  Procedure Laterality Date  . Eye surgery    . Hpv laser removal    . Laser ablation of the cervix      Dr.Eure    Family History  Problem Relation Age of Onset  . Cancer Mother     liver    History  Substance Use Topics  . Smoking status: Former Smoker    Quit date: 05/09/1996  . Smokeless tobacco: Never Used  . Alcohol Use: No    OB History   Grav Para Term Preterm Abortions TAB SAB Ect Mult Living   1 1 1       1       Review of Systems  Constitutional: Negative for fatigue.  HENT: Negative for congestion, sinus pressure and ear discharge.   Eyes: Negative for discharge.  Respiratory: Negative for shortness of breath.   Cardiovascular: Negative for chest pain.  Gastrointestinal: Positive for vomiting. Negative for nausea, abdominal pain and diarrhea.  Genitourinary: Negative for frequency and hematuria.  Musculoskeletal: Positive for back pain.  Skin: Negative for rash.  Neurological: Negative for seizures and headaches.  Psychiatric/Behavioral: Negative for hallucinations.    Allergies  Baclofen; Hydromorphone hcl; Penicillins; and Cefpodoxime proxetil  Home Medications   Current Outpatient Rx  Name  Route  Sig  Dispense  Refill  .  albuterol (PROAIR HFA) 108 (90 BASE) MCG/ACT inhaler   Inhalation   Inhale 1 puff into the lungs every 4 (four) hours as needed. For shortness of breath         . albuterol (PROVENTIL) (2.5 MG/3ML) 0.083% nebulizer solution   Nebulization   Take 2.5 mg by nebulization every 6 (six) hours as needed. For shortness of breath         . azelastine (ASTELIN) 137 MCG/SPRAY nasal spray   Nasal   Place 1 spray into the nose 2 (two) times daily. Use in each nostril as directed   30 mL   3   . Calcium Carbonate-Vitamin D (CALCIUM 600+D PO)   Oral   Take 2 tablets by mouth 2 (two) times daily.          [name] dexlansoprazole (DEXILANT) 60 MG capsule   Oral   Take 60 mg by mouth every  morning.         . fluticasone-salmeterol (ADVAIR HFA) 230-21 MCG/ACT inhaler   Inhalation   Inhale 1 puff into the lungs 2 (two) times daily.           . Olopatadine HCl (PATADAY) 0.2 % SOLN   Ophthalmic   Apply 1 drop to eye daily as needed (for itchy eyes).         [name] oxyCODONE-acetaminophen (PERCOCET/ROXICET) 5-325 MG per tablet   Oral   Take 1 tablet by mouth every 4 (four) hours as needed for pain.   20 tablet   0   . tiotropium (SPIRIVA) 18 MCG inhalation capsule   Inhalation   Place 18 mcg into inhaler and inhale every morning.            Triage Vitals: BP 149/77  Pulse 98  Temp(Src) 97.3 F (36.3 C) (Oral)  Resp 20  SpO2 94%  Physical Exam  Nursing note and vitals reviewed. Constitutional: She is oriented to person, place, and time. She appears well-developed.  HENT:  Head: Normocephalic and atraumatic.  Eyes: Conjunctivae and EOM are normal. No scleral icterus.  Neck: Neck supple. No thyromegaly present.  Cardiovascular: Normal rate and regular rhythm.  Exam reveals no gallop and no friction rub.   No murmur heard. Pulmonary/Chest: No stridor. She has no wheezes. She has no rales. She exhibits no tenderness.  Abdominal: She exhibits no distension. There is no tenderness. There is no rebound.  Musculoskeletal: Normal range of motion. She exhibits tenderness. She exhibits no edema.  Mild tenderness to the mid left lateral back  Lymphadenopathy:    She has no cervical adenopathy.  Neurological: She is oriented to person, place, and time. Coordination normal.  Skin: No rash noted. No erythema.  Psychiatric: She has a normal mood and affect. Her behavior is normal.    ED Course  Procedures (including critical care time)  DIAGNOSTIC STUDIES: Oxygen Saturation is 94% on room air, adequate by my interpretation.    COORDINATION OF CARE: 6:57 PM-Discussed treatment plan which includes Zofran and review of visit yesterday with pt at bedside and pt agreed  to plan.   7:00 PM- Ordered 4 mg Zofran injection  7:38 PM-Pt rechecked and reports improvement with medication listed above.  Discussed discharge plan which includes Zofran and Norco prescriptions with pt and pt agreed to plan. Also advised pt to follow up as needed and pt agreed.  Labs Reviewed - No data to display  Ct Abdomen Pelvis Wo Contrast  03/13/2013  *RADIOLOGY REPORT*  Clinical Data: left flank  pain  CT ABDOMEN AND PELVIS WITHOUT CONTRAST  Technique:  Multidetector CT imaging of the abdomen and pelvis was performed following the standard protocol without intravenous contrast.  Comparison: 04/14/2011  Findings: There are changes of centrilobular and paraseptal emphysema.  There is a nodule in the left base which measures 7 mm, image number eight/series 3.  This is unchanged from previous exam.  No focal liver abnormality.  The gallbladder is normal.  No biliary dilatation.  Normal appearance of the pancreas.  The spleen is unremarkable.  Both adrenal glands are normal.  Hyperattenuating cyst with in the mid pole of the right kidney measures 1.8 cm.  This is increased from 1.3 cm previously.  No right-sided nephrolithiasis or hydronephrosis.  There are no left renal calculi or hydronephrosis noted.  The urinary bladder appears normal.  The uterus and the adnexal structures are unremarkable for patient's age.  The stomach and the small bowel loops are unremarkable.  Normal appearance of the proximal colon.  Scattered distal colonic diverticula noted without acute inflammation.  No free fluid or fluid collections identified within the upper abdomen or the pelvis.  Review of the visualized osseous structures is significant for osteopenia and lumbar spondylosis.  There are bilateral L5 pars defects present.  Anterolisthesis of L5 on S1 measures 1 cm.  IMPRESSION:  1.  No nephrolithiasis or obstructive uropathy. 2.  Slight increase in size of hyperattenuating cyst within the upper pole the right kidney.   This is incompletely characterized without IV contrast. 3.  Lumbar spondylosis with anterolisthesis of L5 on S1.   Original Report Authenticated By: Signa Kell, M.D.      1. Vomiting       MDM  The chart was scribed for me under my direct supervision.  I personally performed the history, physical, and medical decision making and all procedures in the evaluation of this patient.Christine Lennert, MD 03/14/13 [phone]

## 2013-03-14 NOTE — ED Notes (Signed)
Pt was seen in the er yesterday and dx w/ back pain, given oxycodone for pain, she took 1 tablet at 2:15 today and started vomiting around 2:45. Has vomited  Multiple times since today and cont. To have back pain and feel bad.  No resp distress.  Was given zofran 4mg  by ems pta.

## 2013-03-18 ENCOUNTER — Ambulatory Visit (INDEPENDENT_AMBULATORY_CARE_PROVIDER_SITE_OTHER): Payer: Medicare Other | Admitting: Family Medicine

## 2013-03-18 ENCOUNTER — Encounter: Payer: Self-pay | Admitting: Family Medicine

## 2013-03-18 VITALS — BP 130/70 | HR 96 | Resp 19 | Wt 103.0 lb

## 2013-03-18 DIAGNOSIS — R251 Tremor, unspecified: Secondary | ICD-10-CM

## 2013-03-18 DIAGNOSIS — J4489 Other specified chronic obstructive pulmonary disease: Secondary | ICD-10-CM

## 2013-03-18 DIAGNOSIS — E785 Hyperlipidemia, unspecified: Secondary | ICD-10-CM

## 2013-03-18 DIAGNOSIS — M810 Age-related osteoporosis without current pathological fracture: Secondary | ICD-10-CM

## 2013-03-18 DIAGNOSIS — J449 Chronic obstructive pulmonary disease, unspecified: Secondary | ICD-10-CM

## 2013-03-18 DIAGNOSIS — R52 Pain, unspecified: Secondary | ICD-10-CM | POA: Insufficient documentation

## 2013-03-18 DIAGNOSIS — R259 Unspecified abnormal involuntary movements: Secondary | ICD-10-CM

## 2013-03-18 DIAGNOSIS — F411 Generalized anxiety disorder: Secondary | ICD-10-CM

## 2013-03-18 MED ORDER — POLYETHYLENE GLYCOL 3350 17 GM/SCOOP PO POWD: 17.0000 g | Freq: Every day | ORAL | Status: DC

## 2013-03-18 MED ORDER — LORAZEPAM 0.5 MG PO TABS: 0.5000 mg | ORAL_TABLET | Freq: Two times a day (BID) | ORAL | Status: AC | PRN
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
## 2013-03-18 NOTE — Assessment & Plan Note (Signed)
Maintained on calcium and Vit D, unable to tolerate the Bisphosphonate due to GERD symptoms after taking

## 2013-03-18 NOTE — Assessment & Plan Note (Signed)
She gets shakes all over when severely anxious, not a daily occurrence to suggest parkinsons or essential tremor, alert state doubt seizure disorder, quite difficult for her to explain, decided to hold on neurology at this time, pt wants to try meds first

## 2013-03-18 NOTE — Assessment & Plan Note (Signed)
likley MSK, CT abd/pelvis unremarkable, labs normal, UA neg Will have her try tylenol and heating patches to her side No acute abdomen

## 2013-03-18 NOTE — Assessment & Plan Note (Signed)
Check FLP 

## 2013-03-18 NOTE — Assessment & Plan Note (Signed)
Her shakes appear to be episodes of anxiety, will have her try ativan at onset and see how she responds

## 2013-03-18 NOTE — Assessment & Plan Note (Signed)
No recent exacerbations, wears oxygen as needed

## 2013-03-18 NOTE — Patient Instructions (Addendum)
Start miralax once a day for your bowels Get Dulculax supository  Increase your water Try the ativan which is a nerve pill if you get an episode of the shakes Try the salon patches over the counter for muscle spasm Tylenol or ibuprofen F/U 3 months

## 2013-03-24 ENCOUNTER — Telehealth: Payer: Self-pay | Admitting: Family Medicine
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
## 2013-03-25 NOTE — Telephone Encounter (Signed)
I can only write letters for jury duty. I can not write anything else, if she called the law she needs to call the police station or court and tell them she does not remember anything, I can not intervene here

## 2013-03-25 NOTE — Telephone Encounter (Signed)
Patient aware.

## 2013-03-31 ENCOUNTER — Telehealth: Payer: Self-pay | Admitting: Family Medicine

## 2013-03-31 NOTE — Telephone Encounter (Signed)
Called pt back x 2. Line busy

## 2013-04-06 ENCOUNTER — Encounter: Payer: Self-pay | Admitting: *Deleted

## 2013-04-08 ENCOUNTER — Ambulatory Visit: Payer: Medicare Other | Admitting: Family Medicine

## 2013-04-20 ENCOUNTER — Other Ambulatory Visit: Payer: Self-pay

## 2013-04-20 MED ORDER — TIOTROPIUM BROMIDE MONOHYDRATE 18 MCG IN CAPS: 18.0000 ug | ORAL_CAPSULE | Freq: Every morning | RESPIRATORY_TRACT | Status: DC

## 2013-05-04 ENCOUNTER — Encounter: Payer: Self-pay | Admitting: Family Medicine

## 2013-05-12 NOTE — Telephone Encounter (Signed)
Provider transferred.

## 2013-06-07 ENCOUNTER — Other Ambulatory Visit: Payer: Self-pay | Admitting: Family Medicine

## 2013-06-16 ENCOUNTER — Ambulatory Visit: Payer: Self-pay | Admitting: Family Medicine

## 2013-06-17 ENCOUNTER — Ambulatory Visit: Payer: Medicare Other | Admitting: Family Medicine

## 2013-06-23 ENCOUNTER — Ambulatory Visit: Payer: Medicare Other | Admitting: Family Medicine

## 2013-09-08 ENCOUNTER — Encounter: Payer: Medicare Other | Admitting: Family Medicine

## 2013-09-13 ENCOUNTER — Encounter: Payer: Self-pay | Admitting: Family Medicine

## 2013-09-20 ENCOUNTER — Other Ambulatory Visit: Payer: Self-pay | Admitting: *Deleted

## 2013-09-20 DIAGNOSIS — Z0000 Encounter for general adult medical examination without abnormal findings: Secondary | ICD-10-CM

## 2013-09-20 DIAGNOSIS — M810 Age-related osteoporosis without current pathological fracture: Secondary | ICD-10-CM

## 2013-10-05 ENCOUNTER — Encounter: Payer: Self-pay | Admitting: Family Medicine

## 2013-10-05 ENCOUNTER — Other Ambulatory Visit: Payer: Self-pay | Admitting: Family Medicine

## 2013-10-05 NOTE — Telephone Encounter (Signed)
Medication refill for one time only.  Patient needs to be seen.  Letter sent for patient to call and schedule 

## 2013-10-05 NOTE — Telephone Encounter (Signed)
Okay to refill, please call and have her schedule an appointment

## 2013-11-02 ENCOUNTER — Other Ambulatory Visit: Payer: Self-pay | Admitting: Family Medicine

## 2013-11-02 ENCOUNTER — Encounter: Payer: Self-pay | Admitting: Family Medicine

## 2013-11-02 NOTE — Telephone Encounter (Signed)
Medication refill for one time only.  Patient needs to be seen.  Letter sent for patient to call and schedule 

## 2013-11-22 ENCOUNTER — Telehealth: Payer: Self-pay | Admitting: Obstetrics & Gynecology

## 2013-11-23 NOTE — Telephone Encounter (Signed)
Pt stated that she had spoken to the clerk of court this morning and that she cancelled it for the pt. Pt very appreciative of the return call. \

## 2013-11-23 NOTE — Telephone Encounter (Signed)
We have not seen pt and some time and a note for jury duty would not be appropriate

## 2013-12-16 ENCOUNTER — Other Ambulatory Visit: Payer: Self-pay | Admitting: Family Medicine

## 2013-12-17 ENCOUNTER — Telehealth: Payer: Self-pay | Admitting: Obstetrics & Gynecology

## 2013-12-17 ENCOUNTER — Other Ambulatory Visit: Payer: Self-pay | Admitting: Family Medicine

## 2013-12-17 MED ORDER — DEXLANSOPRAZOLE 60 MG PO CPDR: 60.0000 mg | DELAYED_RELEASE_CAPSULE | Freq: Every morning | ORAL | Status: DC

## 2013-12-17 NOTE — Telephone Encounter (Signed)
Pt is due for ov, contacted pt and she will make appt as soon as possible.

## 2014-01-16 ENCOUNTER — Other Ambulatory Visit: Payer: Self-pay | Admitting: Family Medicine

## 2014-01-17 NOTE — Telephone Encounter (Signed)
Pt is not seeing Dr. Jeanice Limurham anymore, no more refills

## 2014-01-17 NOTE — Telephone Encounter (Signed)
PT. Aware DEXIL[facility] AT  PHARMACY.

## 2014-01-24 ENCOUNTER — Telehealth: Payer: Self-pay | Admitting: *Deleted

## 2014-01-24 ENCOUNTER — Other Ambulatory Visit: Payer: Self-pay | Admitting: *Deleted

## 2014-01-24 NOTE — Telephone Encounter (Signed)
Called pt to let her know she is needing to be seen for follow up on medication, she stated that she is no longer pt of [facility] d/t live so far away, says she is going to see [facility] as her PCP.

## 2014-01-26 MED ORDER — TIOTROPIUM BROMIDE MONOHYDRATE 18 MCG IN CAPS: ORAL_CAPSULE | RESPIRATORY_TRACT | Status: DC

## 2014-06-10 ENCOUNTER — Encounter (HOSPITAL_COMMUNITY): Payer: Self-pay | Admitting: Emergency Medicine

## 2014-06-10 ENCOUNTER — Emergency Department (HOSPITAL_COMMUNITY)
Admission: EM | Admit: 2014-06-10 | Discharge: 2014-06-10 | Disposition: A | Discharge status: Home / Self Care | Payer: Medicare Other | Attending: Emergency Medicine | Admitting: Emergency Medicine

## 2014-06-10 DIAGNOSIS — S80822A Blister (nonthermal), left lower leg, initial encounter: Secondary | ICD-10-CM

## 2014-06-10 DIAGNOSIS — Z79899 Other long term (current) drug therapy: Secondary | ICD-10-CM | POA: Diagnosis not present

## 2014-06-10 DIAGNOSIS — Z8619 Personal history of other infectious and parasitic diseases: Secondary | ICD-10-CM | POA: Insufficient documentation

## 2014-06-10 DIAGNOSIS — J438 Other emphysema: Secondary | ICD-10-CM | POA: Diagnosis not present

## 2014-06-10 DIAGNOSIS — Z8659 Personal history of other mental and behavioral disorders: Secondary | ICD-10-CM | POA: Insufficient documentation

## 2014-06-10 DIAGNOSIS — Z87891 Personal history of nicotine dependence: Secondary | ICD-10-CM | POA: Insufficient documentation

## 2014-06-10 DIAGNOSIS — L988 Other specified disorders of the skin and subcutaneous tissue: Secondary | ICD-10-CM | POA: Insufficient documentation

## 2014-06-10 DIAGNOSIS — K219 Gastro-esophageal reflux disease without esophagitis: Secondary | ICD-10-CM | POA: Insufficient documentation

## 2014-06-10 DIAGNOSIS — Z8669 Personal history of other diseases of the nervous system and sense organs: Secondary | ICD-10-CM | POA: Insufficient documentation

## 2014-06-10 DIAGNOSIS — Z880 Allergy status to penicillin: Secondary | ICD-10-CM | POA: Diagnosis not present

## 2014-06-10 DIAGNOSIS — Z8739 Personal history of other diseases of the musculoskeletal system and connective tissue: Secondary | ICD-10-CM | POA: Diagnosis not present

## 2014-06-10 DIAGNOSIS — IMO0002 Reserved for concepts with insufficient information to code with codable children: Secondary | ICD-10-CM | POA: Insufficient documentation

## 2014-06-10 MED ORDER — DOUBLE ANTIBIOTIC 500-10000 UNIT/GM EX OINT
TOPICAL_OINTMENT | Freq: Once | CUTANEOUS | Status: AC
  Administered 2014-06-10: 1 via TOPICAL
  Filled 2014-06-10: qty 1

## 2014-06-10 MED ORDER — DOUBLE ANTIBIOTIC 500-10000 UNIT/GM EX OINT
TOPICAL_OINTMENT | CUTANEOUS | Status: AC
  Filled 2014-06-10: qty 1

## 2014-06-10 NOTE — ED Notes (Signed)
EDP at bedside  
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
## 2014-06-10 NOTE — ED Notes (Signed)
No active drainage noted at time of dressing. Polysporin and telfa applied to site and secured with paper tape. Pt tolerated well. nad noted.

## 2014-06-10 NOTE — ED Notes (Signed)
Pt has an area on the inside of left lower leg that is weeping yellow fluid.  Pt denies injury

## 2014-06-10 NOTE — Discharge Instructions (Signed)
Apply antibacterial ointment to the area twice daily and wrap with a nonstick bandage.  Please keep a very close eye on this area.  If the area seems to be worsening of the redness is spreading please return to the emergency department for evaluation.  If you develop fever or chills please come back to see us

## 2014-06-11 ENCOUNTER — Emergency Department (HOSPITAL_COMMUNITY)
Admission: EM | Admit: 2014-06-11 | Discharge: 2014-06-11 | Disposition: A | Discharge status: Home / Self Care | Payer: Medicare Other | Attending: Emergency Medicine | Admitting: Emergency Medicine

## 2014-06-11 ENCOUNTER — Encounter (HOSPITAL_COMMUNITY): Payer: Self-pay | Admitting: Emergency Medicine

## 2014-06-11 DIAGNOSIS — Z8619 Personal history of other infectious and parasitic diseases: Secondary | ICD-10-CM | POA: Insufficient documentation

## 2014-06-11 DIAGNOSIS — L02419 Cutaneous abscess of limb, unspecified: Secondary | ICD-10-CM | POA: Insufficient documentation

## 2014-06-11 DIAGNOSIS — Z79899 Other long term (current) drug therapy: Secondary | ICD-10-CM | POA: Diagnosis not present

## 2014-06-11 DIAGNOSIS — Z8659 Personal history of other mental and behavioral disorders: Secondary | ICD-10-CM | POA: Insufficient documentation

## 2014-06-11 DIAGNOSIS — L03116 Cellulitis of left lower limb: Secondary | ICD-10-CM

## 2014-06-11 DIAGNOSIS — L03119 Cellulitis of unspecified part of limb: Secondary | ICD-10-CM | POA: Diagnosis not present

## 2014-06-11 DIAGNOSIS — Z87891 Personal history of nicotine dependence: Secondary | ICD-10-CM | POA: Insufficient documentation

## 2014-06-11 DIAGNOSIS — Z8669 Personal history of other diseases of the nervous system and sense organs: Secondary | ICD-10-CM | POA: Insufficient documentation

## 2014-06-11 DIAGNOSIS — Z8739 Personal history of other diseases of the musculoskeletal system and connective tissue: Secondary | ICD-10-CM | POA: Diagnosis not present

## 2014-06-11 DIAGNOSIS — J438 Other emphysema: Secondary | ICD-10-CM | POA: Diagnosis not present

## 2014-06-11 DIAGNOSIS — K219 Gastro-esophageal reflux disease without esophagitis: Secondary | ICD-10-CM | POA: Diagnosis not present

## 2014-06-11 DIAGNOSIS — Z4800 Encounter for change or removal of nonsurgical wound dressing: Secondary | ICD-10-CM | POA: Diagnosis present

## 2014-06-11 DIAGNOSIS — IMO0002 Reserved for concepts with insufficient information to code with codable children: Secondary | ICD-10-CM | POA: Insufficient documentation

## 2014-06-11 MED ORDER — CEPHALEXIN 500 MG PO CAPS
500.0000 mg | ORAL_CAPSULE | Freq: Once | ORAL | Status: AC
  Administered 2014-06-11: 500 mg via ORAL
  Filled 2014-06-11: qty 1

## 2014-06-11 MED ORDER — CEPHALEXIN 500 MG PO CAPS: 500.0000 mg | ORAL_CAPSULE | Freq: Four times a day (QID) | ORAL | Status: DC

## 2014-06-11 NOTE — Discharge Instructions (Signed)
Cellulitis Cellulitis is an infection of the skin and the tissue beneath it. The infected area is usually red and tender. Cellulitis occurs most often in the arms and lower legs.  CAUSES  Cellulitis is caused by bacteria that enter the skin through cracks or cuts in the skin. The most common types of bacteria that cause cellulitis are Staphylococcus and Streptococcus. SYMPTOMS   Redness and warmth.  Swelling.  Tenderness or pain.  Fever. DIAGNOSIS  Your caregiver can usually determine what is wrong based on a physical exam. Blood tests may also be done. TREATMENT  Treatment usually involves taking an antibiotic medicine. HOME CARE INSTRUCTIONS   Take your antibiotics as directed. Finish them even if you start to feel better.  Keep the infected arm or leg elevated to reduce swelling.  Apply a warm cloth to the affected area up to 4 times per day to relieve pain.  Only take over-the-counter or prescription medicines for pain, discomfort, or fever as directed by your caregiver.  Keep all follow-up appointments as directed by your caregiver. SEEK MEDICAL CARE IF:   You notice red streaks coming from the infected area.  Your red area gets larger or turns dark in color.  Your bone or joint underneath the infected area becomes painful after the skin has healed.  Your infection returns in the same area or another area.  You notice a swollen bump in the infected area.  You develop new symptoms. SEEK IMMEDIATE MEDICAL CARE IF:   You have a fever.  You feel very sleepy.  You develop vomiting or diarrhea.  You have a general ill feeling (malaise) with muscle aches and pains. MAKE SURE YOU:   Understand these instructions.  Will watch your condition.  Will get help right away if you are not doing well or get worse. Document Released: 09/04/2005 Document Revised: 05/26/2012 Document Reviewed: 02/10/2012 Exit[employer] Patient Information 2015 [employer]. This information is  not intended to replace advice given to you by your health care provider. Make sure you discuss any questions you have with your health care provider.  

## 2014-06-11 NOTE — ED Provider Notes (Signed)
CSN: [id number]     Arrival date & time 06/11/14  [id number] History   This chart was scribed for Raeford RazorStephen Taevion Sikora, MD by [place]azil[place] Peace, ED Scribe. The patient was seen in APA03/APA03. The patient's care was started at 8:30 AM.     Chief Complaint  Patient presents with  . Wound Check      Patient is a 79 y.o. female presenting with wound check. The history is provided by the patient. No language interpreter was used.  Wound Check The current episode started more than 2 days ago. The problem has been gradually worsening.  HPI Comments: Christine Garza is a 79 y.o. female who presents to the Emergency Department complaining of problematic wounded area to her lower left leg under her calf onset 3 days ago. Pt states that was she was seen yesterday at the ED about this same problem but states it has worsened pta. She reports that area of concern has fluid build up in it as well that can be felt by applying pressure to it. She further reports no known events or activities that could be responsible for current issue. Pt denies any fever, chills, or numbness and tingling in her foot.   Past Medical History  Diagnosis Date  . Allergy   . Emphysema of lung   . Osteoporosis   . GERD (gastroesophageal reflux disease)   . Pulmonary nodules/lesions, multiple     2 nodules on left side- most recent in 2012 LLL  . HSV-1 (herpes simplex virus 1) infection     Valtrex prn Cold sores  . Depression   . Cataract     Dr. Gardenia PhlegmHecker [place]   . Dry eye syndrome   . HPV (human papilloma virus) infection     Dr. Despina HiddenEure s/p lasar ablation  . DDD (degenerative disc disease), lumbar    Past Surgical History  Procedure Laterality Date  . Eye surgery    . Hpv laser removal    . Laser ablation of the cervix      Dr.Eure   Family History  Problem Relation Age of Onset  . Cancer Mother     liver   History  Substance Use Topics  . Smoking status: Former Smoker    Quit date: 05/09/1996  . Smokeless tobacco:  Never Used  . Alcohol Use: No   OB History   Grav Para Term Preterm Abortions TAB SAB Ect Mult Living   1 1 1       1      Review of Systems  Constitutional: Negative for fever and chills.  Skin: Positive for color change and wound (Lower left leg).  Neurological: Negative for numbness.  All other systems reviewed and are negative.     Allergies  Baclofen; Hydrocodone; Hydromorphone hcl; Oxycodone; and Cefpodoxime proxetil  Home Medications   Prior to Admission medications   Medication Sig Start Date End Date Taking? Authorizing Provider  albuterol (PROAIR HFA) 108 (90 BASE) MCG/ACT inhaler Inhale 1 puff into the lungs every 4 (four) hours as needed. For shortness of breath    Historical Provider, MD  albuterol (PROVENTIL) (2.5 MG/3ML) 0.083% nebulizer solution Take 2.5 mg by nebulization every 6 (six) hours as needed. For shortness of breath    Historical Provider, MD  azelastine (ASTELIN) 137 MCG/SPRAY nasal spray Place 1 spray into the nose 2 (two) times daily. Use in each nostril as directed 03/12/13   Salley ScarletKawanta F [place], MD  Calcium Carbonate-Vitamin D (CALCIUM 600+D PO) Take 2  tablets by mouth 2 (two) times daily.     Historical Provider, MD  cephALEXin (KEFLEX) 500 MG capsule Take 1 capsule (500 mg total) by mouth 4 (four) times daily. 06/11/14   Raeford RazorStephen Freyja Govea, MD  DEXILANT 60 MG capsule TAKE 1 CAPSULE BY MOUTH EVERY DAY 06/07/13   Salley ScarletKawanta F [place], MD  dexlansoprazole (DEXILANT) 60 MG capsule Take 1 capsule (60 mg total) by mouth every morning. 12/17/13   Lazaro ArmsLuther H Eure, MD  fluticasone-salmeterol (ADVAIR HFA) [phone] MCG/ACT inhaler Inhale 1 puff into the lungs 2 (two) times daily.      Historical Provider, MD  Olopatadine HCl (PATADAY) 0.2 % SOLN Apply 1 drop to eye daily as needed (for itchy eyes).    Historical Provider, MD  polyethylene glycol powder (GLYCOLAX/MIRALAX) powder Take 17 g by mouth daily. 03/18/13   Salley ScarletKawanta F [place], MD  tiotropium (SPIRIVA HANDIHALER) 18 MCG inhalation  capsule INHALE THE CONTENTS OF ONE CAPSULE VIA INHALER EVERY MORNING 01/26/14   Lazaro ArmsLuther H Eure, MD   Triage Vitals: BP 162/88  Pulse 110  Temp(Src) 97.5 F (36.4 C)  Resp 24  Ht 5' (1.524 m)  Wt 98 lb (44.453 kg)  BMI 19.14 kg/m2  SpO2 100% Physical Exam  Nursing note and vitals reviewed. Constitutional: She is oriented to person, place, and time. She appears well-developed and well-nourished. No distress.  HENT:  Head: Normocephalic and atraumatic.  Eyes: Conjunctivae and EOM are normal.  Neck: Neck supple. No tracheal deviation present.  Cardiovascular: Normal rate and intact distal pulses.   Pulmonary/Chest: Effort normal and breath sounds normal. No respiratory distress.  Musculoskeletal: Normal range of motion.  Neurological: She is alert and oriented to person, place, and time.  Skin: Skin is warm and dry. She is not diaphoretic. There is erythema.  Fluid filled blister to the medial aspect of lower left leg. Mild erythema and increased warmth.   Psychiatric: She has a normal mood and affect. Her behavior is normal.    ED Course  Procedures (including critical care time) DIAGNOSTIC STUDIES: Oxygen Saturation is 100% on room air, normal by my interpretation.    COORDINATION OF CARE: 8:35 AM- Treatment plan was discussed with patient who verbalizes understanding and agrees.    Labs Review Labs Reviewed - No data to display  Imaging Review No results found.   EKG Interpretation None     Medications  cephALEXin (KEFLEX) capsule 500 mg (not administered)    MDM   Final diagnoses:  Cellulitis of left lower extremity    79yF who may be beginning to develop cellulitis around small blister to LE. No clinical signs of DVT. I feel appropriate for outpt tx. Return precautions discussed.   I personally preformed the services scribed in my presence. The recorded information has been reviewed is accurate. Raeford RazorStephen Ellis Mehaffey, MD.    Raeford RazorStephen Beryl Hornberger, MD 06/21/14 [id number]

## 2014-06-11 NOTE — ED Notes (Signed)
Pt is here for recheck of wound to left lower leg, which she was seen here for yesterday. Pt states area is worse.

## 2014-06-20 ENCOUNTER — Telehealth: Payer: Self-pay | Admitting: Internal Medicine

## 2014-06-20 NOTE — Telephone Encounter (Signed)
Pt has not been seen since 10/2010.  Made her aware she is like a new patient to us now. Her PCP moved away and have not found a new one. She has been off this medication for a while now. She is aware she needs to schedule an appt. She will call bacxk when she can

## 2014-07-23 ENCOUNTER — Other Ambulatory Visit: Payer: Self-pay | Admitting: Family Medicine

## 2014-07-23 NOTE — Telephone Encounter (Signed)
Refill denied.   Patient should contact office first.  

## 2014-10-10 ENCOUNTER — Encounter (HOSPITAL_COMMUNITY): Payer: Self-pay | Admitting: Emergency Medicine

## 2014-10-27 ENCOUNTER — Telehealth: Payer: Self-pay | Admitting: *Deleted

## 2014-10-27 ENCOUNTER — Telehealth: Payer: Self-pay | Admitting: Obstetrics & Gynecology

## 2014-10-27 MED ORDER — TIOTROPIUM BROMIDE MONOHYDRATE 18 MCG IN CAPS: ORAL_CAPSULE | RESPIRATORY_TRACT | Status: DC

## 2014-10-27 NOTE — Telephone Encounter (Signed)
Pt informed [facility] [place]. Pt also informed PCP will need to prescribed in future. Pt verbalized understanding.

## 2014-10-27 NOTE — Telephone Encounter (Signed)
Pt is requesting Dr. Despina HiddenEure to refill the Spiriva x 1 until she can be seen by Dr. Jeanice Limurham tomorrow.

## 2014-10-28 ENCOUNTER — Ambulatory Visit: Payer: Medicare Other | Admitting: Family Medicine

## 2014-11-02 ENCOUNTER — Ambulatory Visit: Payer: Medicare Other | Admitting: Family Medicine

## 2014-11-08 ENCOUNTER — Ambulatory Visit: Payer: Medicare Other | Admitting: Obstetrics & Gynecology

## 2014-11-14 ENCOUNTER — Ambulatory Visit: Payer: Medicare Other | Admitting: Obstetrics & Gynecology

## 2014-11-15 ENCOUNTER — Ambulatory Visit: Payer: Medicare Other | Admitting: Family Medicine

## 2014-11-16 ENCOUNTER — Emergency Department (HOSPITAL_COMMUNITY)
Admission: EM | Admit: 2014-11-16 | Discharge: 2014-11-16 | Disposition: A | Discharge status: Home / Self Care | Payer: Medicare Other | Attending: Emergency Medicine | Admitting: Emergency Medicine

## 2014-11-16 ENCOUNTER — Encounter (HOSPITAL_COMMUNITY): Payer: Self-pay | Admitting: Emergency Medicine

## 2014-11-16 DIAGNOSIS — Z79899 Other long term (current) drug therapy: Secondary | ICD-10-CM | POA: Insufficient documentation

## 2014-11-16 DIAGNOSIS — Z9981 Dependence on supplemental oxygen: Secondary | ICD-10-CM | POA: Diagnosis not present

## 2014-11-16 DIAGNOSIS — Z8619 Personal history of other infectious and parasitic diseases: Secondary | ICD-10-CM | POA: Insufficient documentation

## 2014-11-16 DIAGNOSIS — L03115 Cellulitis of right lower limb: Secondary | ICD-10-CM | POA: Diagnosis not present

## 2014-11-16 DIAGNOSIS — Z87891 Personal history of nicotine dependence: Secondary | ICD-10-CM | POA: Insufficient documentation

## 2014-11-16 DIAGNOSIS — Z7951 Long term (current) use of inhaled steroids: Secondary | ICD-10-CM | POA: Insufficient documentation

## 2014-11-16 DIAGNOSIS — M810 Age-related osteoporosis without current pathological fracture: Secondary | ICD-10-CM | POA: Insufficient documentation

## 2014-11-16 DIAGNOSIS — R0602 Shortness of breath: Secondary | ICD-10-CM | POA: Insufficient documentation

## 2014-11-16 DIAGNOSIS — J439 Emphysema, unspecified: Secondary | ICD-10-CM | POA: Diagnosis not present

## 2014-11-16 DIAGNOSIS — Z8659 Personal history of other mental and behavioral disorders: Secondary | ICD-10-CM | POA: Diagnosis not present

## 2014-11-16 DIAGNOSIS — K219 Gastro-esophageal reflux disease without esophagitis: Secondary | ICD-10-CM | POA: Insufficient documentation

## 2014-11-16 DIAGNOSIS — Z792 Long term (current) use of antibiotics: Secondary | ICD-10-CM | POA: Insufficient documentation

## 2014-11-16 DIAGNOSIS — Z8669 Personal history of other diseases of the nervous system and sense organs: Secondary | ICD-10-CM | POA: Insufficient documentation

## 2014-11-16 DIAGNOSIS — L538 Other specified erythematous conditions: Secondary | ICD-10-CM | POA: Diagnosis present

## 2014-11-16 MED ORDER — SULFAMETHOXAZOLE-TRIMETHOPRIM 800-160 MG PO TABS: 1.0000 | ORAL_TABLET | Freq: Two times a day (BID) | ORAL | Status: DC

## 2014-11-16 MED ORDER — SULFAMETHOXAZOLE-TRIMETHOPRIM 800-160 MG PO TABS
1.0000 | ORAL_TABLET | Freq: Once | ORAL | Status: AC
  Administered 2014-11-16: 1 via ORAL
  Filled 2014-11-16: qty 1

## 2014-11-16 NOTE — Discharge Instructions (Signed)
Antibiotic twice a day. Keep wound clean. Simple dressing. Elevate foot.

## 2014-11-16 NOTE — ED Notes (Signed)
Patient ambulated to bathroom with no assistance or difficulty. 

## 2014-11-16 NOTE — ED Notes (Signed)
Patient states "I was trying to crank my car to come to the hospital anyway because I have this sore on my leg that I want the doctor to look at." Patient has closed wound and redness to the bottom of lower extremity.

## 2014-11-16 NOTE — ED Notes (Signed)
Patient states "I was walking back and forth trying to get my car cranked and it wouldn't crank and I got short of breath. I tried using my oxygen but it wouldn't help."

## 2014-11-16 NOTE — ED Provider Notes (Addendum)
CSN: [id number]     Arrival date & time 11/16/14  0830 History  This chart was scribed for Christine HutchingBrian Aleysia Oltmann, MD by Ronney LionSuzanne Le, ED Scribe. This patient was seen in room APA11/APA11 and the patient's care was started at 9:24 AM.    Chief Complaint  Patient presents with  . Shortness of Breath   The history is provided by the patient. No language interpreter was used.    HPI Comments: Christine OremJosephine L Garza is a 79 y.o. female who presents to the Emergency Department complaining of   worsening erythema to her right medial ankle that began 3 days ago after bumping the area with her shoe. Patient has a history of emphysema and uses home oxygen occasionally when doing housework. She says her breathing has been normal. Patient quit smoking almost 20 years ago. She is retired, although she used to work at a [employer] in [place]. No fever sweats or chills. Severity is mild. No chest pain or new dyspnea.  Past Medical History  Diagnosis Date  . Allergy   . Emphysema of lung   . Osteoporosis   . GERD (gastroesophageal reflux disease)   . Pulmonary nodules/lesions, multiple     2 nodules on left side- most recent in 2012 LLL  . HSV-1 (herpes simplex virus 1) infection     Valtrex prn Cold sores  . Depression   . Cataract     Dr. Gardenia PhlegmHecker [place]  . Dry eye syndrome   . HPV (human papilloma virus) infection     Dr. Despina HiddenEure s/p lasar ablation  . DDD (degenerative disc disease), lumbar    Past Surgical History  Procedure Laterality Date  . Eye surgery    . Hpv laser removal    . Laser ablation of the cervix      Dr.Eure   Family History  Problem Relation Age of Onset  . Cancer Mother     liver   History  Substance Use Topics  . Smoking status: Former Smoker    Quit date: 05/09/1996  . Smokeless tobacco: Never Used  . Alcohol Use: No   OB History    Gravida Para Term Preterm AB TAB SAB Ectopic Multiple Living   1 1 1       1      Review of Systems  Respiratory:  Positive for shortness of breath.   Skin: Positive for wound.  All other systems reviewed and are negative.     Allergies  Baclofen; Hydrocodone; Hydromorphone hcl; Oxycodone; and Cefpodoxime proxetil  Home Medications   Prior to Admission medications   Medication Sig Start Date End Date Taking? Authorizing Provider  albuterol (PROAIR HFA) 108 (90 BASE) MCG/ACT inhaler Inhale 1 puff into the lungs every 4 (four) hours as needed. For shortness of breath   Yes Historical Provider, MD  albuterol (PROVENTIL) (2.5 MG/3ML) 0.083% nebulizer solution Take 2.5 mg by nebulization every 6 (six) hours as needed. For shortness of breath   Yes Historical Provider, MD  azelastine (ASTELIN) 137 MCG/SPRAY nasal spray Place 1 spray into the nose 2 (two) times daily. Use in each nostril as directed 03/12/13  Yes Salley ScarletKawanta F [place], MD  Calcium Carbonate-Vitamin D (CALCIUM 600+D PO) Take 2 tablets by mouth 2 (two) times daily.    Yes Historical Provider, MD  cycloSPORINE (RESTASIS) 0.05 % ophthalmic emulsion Place 1 drop into both eyes 2 (two) times daily.   Yes Historical Provider, MD  dexlansoprazole (DEXILANT) 60 MG capsule Take 1 capsule (  60 mg total) by mouth every morning. 12/17/13  Yes Lazaro ArmsLuther H Eure, MD  fluticasone-salmeterol (ADVAIR HFA) 230-21 MCG/ACT inhaler Inhale 1 puff into the lungs 2 (two) times daily.     Yes Historical Provider, MD  tiotropium (SPIRIVA HANDIHALER) 18 MCG inhalation capsule INHALE THE CONTENTS OF ONE CAPSULE VIA INHALER EVERY MORNING Patient taking differently: Place 18 mcg into inhaler and inhale daily. INHALE THE CONTENTS OF ONE CAPSULE VIA INHALER EVERY MORNING 10/27/14  Yes Lazaro ArmsLuther H Eure, MD  cephALEXin (KEFLEX) 500 MG capsule Take 1 capsule (500 mg total) by mouth 4 (four) times daily. Patient not taking: Reported on 11/16/2014 06/11/14   Raeford RazorStephen [place], MD  DEXILANT 60 MG capsule TAKE 1 CAPSULE BY MOUTH EVERY DAY Patient not taking: Reported on 11/16/2014 06/07/13   Salley ScarletKawanta F  [place], MD  Olopatadine HCl (PATADAY) 0.2 % SOLN Apply 1 drop to eye daily as needed (for itchy eyes).    Historical Provider, MD  polyethylene glycol powder (GLYCOLAX/MIRALAX) powder Take 17 g by mouth daily. 03/18/13   Salley ScarletKawanta F [place], MD  sulfamethoxazole-trimethoprim (SEPTRA DS) 800-160 MG per tablet Take 1 tablet by mouth 2 (two) times daily. 11/16/14   Christine HutchingBrian Ayan [place], MD   BP 135/83 mmHg  Pulse 100  Temp(Src) 97.5 F (36.4 C) (Oral)  Resp 32  Ht 5\' 2"  (1.575 m)  Wt 98 lb (44.453 kg)  BMI 17.92 kg/m2  SpO2 99% Physical Exam  Skin:  Area of erythema 2.5 cm in diameter on right ankle, with abrasion centrally.     ED Course  Procedures (including critical care time)  DIAGNOSTIC STUDIES: Oxygen Saturation is 99% on room air, normal by my interpretation.    COORDINATION OF CARE: 9:28 AM - Discussed treatment plan with pt at bedside which includes antibiotics and pt agreed to plan.   Labs Review Labs Reviewed - No data to display  Imaging Review No results found.   EKG Interpretation None      Date: 11/16/2014  Rate: 92  Rhythm: normal sinus rhythm  QRS Axis: right  Intervals: normal  ST/T Wave abnormalities: normal  Conduction Disutrbances: pvc  Narrative Interpretation: unremarkable    MDM   Final diagnoses:  Cellulitis of right ankle   History and physical consistent with cellulitis of right medial ankle. Rx Septra DS twice a day. Respiratory status stable.  I personally performed the services described in this documentation, which was scribed in my presence. The recorded information has been reviewed and is accurate.        Christine HutchingBrian Khaliq Turay, MD 11/16/14 1113  Christine HutchingBrian Bryann Gentz, MD 11/16/14 1325

## 2014-11-24 ENCOUNTER — Telehealth: Payer: Self-pay | Admitting: Family Medicine

## 2014-11-24 NOTE — Telephone Encounter (Signed)
[phone] Pt is needing to speak to Dr Jeanice Limurham about helping her get on meals on wheels.  --------She sounded like she was having a hard time on the phone. Can you please call her to make sure she is okay.

## 2014-11-24 NOTE — Telephone Encounter (Signed)
I [facility] [place] [facility].  Explain situation to them.  Pt stuck in her house with no food.  Unable to prepare food.  Has no one, no transportation. They are going to look into helping her.

## 2014-11-25 ENCOUNTER — Telehealth: Payer: Self-pay | Admitting: Family Medicine

## 2014-11-25 NOTE — Telephone Encounter (Signed)
Called patient and told her someone from [facility] will be contacting her to get her help.

## 2014-11-25 NOTE — Telephone Encounter (Signed)
I have not seen pt since April of 2014, she went [place] in [place], therefore I do not know any information about her current state or medications, they need to call her current doctor. She never can in for the re-establishment appointment

## 2014-11-25 NOTE — Telephone Encounter (Signed)
[facility] did go see patient.  She had some food at home.  Due to health has trouble preparing or getting to.  Memory very bad.  They are going to assist her with help as can.  Have made appt with you for 12/13/14.  Social worker is going to bring her herself to appt.

## 2014-11-25 NOTE — Telephone Encounter (Signed)
noted 

## 2014-12-13 ENCOUNTER — Ambulatory Visit (INDEPENDENT_AMBULATORY_CARE_PROVIDER_SITE_OTHER): Payer: Medicare Other | Admitting: Family Medicine

## 2014-12-13 ENCOUNTER — Encounter: Payer: Self-pay | Admitting: Family Medicine

## 2014-12-13 VITALS — BP 120/64 | HR 100 | Temp 97.9°F | Resp 22 | Ht 62.0 in | Wt 97.0 lb

## 2014-12-13 DIAGNOSIS — M8949 Other hypertrophic osteoarthropathy, multiple sites: Secondary | ICD-10-CM

## 2014-12-13 DIAGNOSIS — M150 Primary generalized (osteo)arthritis: Secondary | ICD-10-CM

## 2014-12-13 DIAGNOSIS — K219 Gastro-esophageal reflux disease without esophagitis: Secondary | ICD-10-CM

## 2014-12-13 DIAGNOSIS — F411 Generalized anxiety disorder: Secondary | ICD-10-CM

## 2014-12-13 DIAGNOSIS — J411 Mucopurulent chronic bronchitis: Secondary | ICD-10-CM

## 2014-12-13 DIAGNOSIS — E46 Unspecified protein-calorie malnutrition: Secondary | ICD-10-CM

## 2014-12-13 DIAGNOSIS — M159 Polyosteoarthritis, unspecified: Secondary | ICD-10-CM

## 2014-12-13 DIAGNOSIS — Z23 Encounter for immunization: Secondary | ICD-10-CM

## 2014-12-13 DIAGNOSIS — E785 Hyperlipidemia, unspecified: Secondary | ICD-10-CM

## 2014-12-13 LAB — CBC WITH DIFFERENTIAL/PLATELET
Basophils Absolute: 0.1 10*3/uL (ref 0.0–0.1)
Basophils Relative: 1 % (ref 0–1)
EOS PCT: 1 % (ref 0–5)
Eosinophils Absolute: 0.1 10*3/uL (ref 0.0–0.7)
HEMATOCRIT: 41.9 % (ref 36.0–46.0)
HEMOGLOBIN: 14.4 g/dL (ref 12.0–15.0)
LYMPHS ABS: 1.5 10*3/uL (ref 0.7–4.0)
LYMPHS PCT: 22 % (ref 12–46)
MCH: 31.7 pg (ref 26.0–34.0)
MCHC: 34.4 g/dL (ref 30.0–36.0)
MCV: 92.3 fL (ref 78.0–100.0)
MONO ABS: 0.6 10*3/uL (ref 0.1–1.0)
MONOS PCT: 9 % (ref 3–12)
MPV: 9.4 fL (ref 8.6–12.4)
NEUTROS ABS: 4.4 10*3/uL (ref 1.7–7.7)
NEUTROS PCT: 67 % (ref 43–77)
Platelets: 267 10*3/uL (ref 150–400)
RBC: 4.54 MIL/uL (ref 3.87–5.11)
RDW: 14 % (ref 11.5–15.5)
WBC: 6.6 10*3/uL (ref 4.0–10.5)

## 2014-12-13 LAB — COMPREHENSIVE METABOLIC PANEL
ALBUMIN: 4.3 g/dL (ref 3.5–5.2)
ALK PHOS: 87 U/L (ref 39–117)
ALT: 11 U/L (ref 0–35)
AST: 22 U/L (ref 0–37)
BUN: 13 mg/dL (ref 6–23)
CALCIUM: 9.7 mg/dL (ref 8.4–10.5)
CHLORIDE: 97 meq/L (ref 96–112)
CO2: 29 mEq/L (ref 19–32)
Creat: 1.01 mg/dL (ref 0.50–1.10)
GLUCOSE: 88 mg/dL (ref 70–99)
POTASSIUM: 5.1 meq/L (ref 3.5–5.3)
SODIUM: 138 meq/L (ref 135–145)
TOTAL PROTEIN: 6.6 g/dL (ref 6.0–8.3)
Total Bilirubin: 0.4 mg/dL (ref 0.2–1.2)

## 2014-12-13 LAB — TSH: TSH: 2.749 u[IU]/mL (ref 0.350–4.500)

## 2014-12-13 LAB — LIPID PANEL
CHOLESTEROL: 242 mg/dL — AB (ref 0–200)
HDL: 74 mg/dL (ref >39)
LDL Cholesterol: 147 mg/dL — ABNORMAL HIGH (ref 0–99)
TRIGLYCERIDES: 104 mg/dL (ref <150)
Total CHOL/HDL Ratio: 3.3 Ratio
VLDL: 21 mg/dL (ref 0–40)

## 2014-12-13 MED ORDER — TIOTROPIUM BROMIDE MONOHYDRATE 18 MCG IN CAPS: 18.0000 ug | ORAL_CAPSULE | Freq: Every day | RESPIRATORY_TRACT | Status: DC

## 2014-12-13 MED ORDER — CYCLOSPORINE 0.05 % OP EMUL: 1.0000 [drp] | Freq: Two times a day (BID) | OPHTHALMIC | Status: DC

## 2014-12-13 MED ORDER — ALBUTEROL SULFATE HFA 108 (90 BASE) MCG/ACT IN AERS: 2.0000 | INHALATION_SPRAY | RESPIRATORY_TRACT | Status: DC | PRN

## 2014-12-13 MED ORDER — ALBUTEROL SULFATE (2.5 MG/3ML) 0.083% IN NEBU: 2.5000 mg | INHALATION_SOLUTION | Freq: Four times a day (QID) | RESPIRATORY_TRACT | Status: DC | PRN

## 2014-12-13 MED ORDER — FLUTICASONE-SALMETEROL 230-21 MCG/ACT IN AERO: 1.0000 | INHALATION_SPRAY | Freq: Two times a day (BID) | RESPIRATORY_TRACT | Status: DC

## 2014-12-13 NOTE — Assessment & Plan Note (Signed)
No current meds, with this being our first visit will monitor and see how anxiety is affecting her May need a low dose SSRI

## 2014-12-13 NOTE — Assessment & Plan Note (Signed)
Severe COPD, given spiriva from office, restart advair, did not do well with Dulera in past  Continue oxygen, we will call and make sure empty tanks are [facility] with [facility]ation and obtaining food PCS services to be ordered she needs more care at home, declines any ALF, will give a try with home PCS services and see how she does, ? If adopted son has power of attorney she has no paperwork that she can think of.

## 2014-12-13 NOTE — Patient Instructions (Addendum)
Flu shot and pneumonia shot given We will contact oxygen supply at [facility] Use Spiriva once a day PCS services to be set [facility] Drink Ensure twice a day  F/U 6 weeks

## 2014-12-13 NOTE — Progress Notes (Signed)
Patient ID: Christine Garza, female   DOB: 1935-04-15, 80 y.o.   MRN: [id number]   Subjective:    Patient ID: Christine Garza, female    DOB: 20-Jan-1935, 80 y.o.   MRN: [id number]  Patient presents for Check Up and Various patient here with social worker Kym Groom. I have not seen her since April 2014. Much of the concern for lies around her being at home by herself. Adult Protective Services was called on December 17 as Christine Garza was calling around telling people that she was without food and needed help. She does have a "" adopted son who is not blood Related Name Ln., Lodema Hong who does look in on her and tries to grocery shop for her and help her out as much as he can. She has history of COPD which is severe she is currently on oxygen she typically wears 2 L she has not had her inhalers refilled which were Spiriva and Advair for quite some time. She was also on medications for acid reflux in the past as well as eyedrops and is unclear if she is actually taking these medications. She states that she does not take any pills on a regular basis. It looks like the gynecologist did refill one of her inhalers a couple months ago. She does not want to go to assisted living facility and wants to stay in her home however she does not have any assistance. She does qualify for personal care services through counseling on aging and we will try to get this set up for her. She is also due for a flu shot and pneumonia shot  Nutrition she currently has some food at the house but keeping food has been a problem and she does not typically leave the home. Per above that adopted son does help her when he can. She will qualify for ensure we can get her through the agency for counseling and aging. He denies any pain denies any recent falls. She was seen in the emergency room for cellulitis about a month ago however she did not complete all these antibiotics as the social worker found some of this left over in her  home.    Review Of Systems:  GEN- denies fatigue, fever, weight loss,weakness, recent illness HEENT- denies eye drainage, change in vision, nasal discharge, CVS- denies chest pain, palpitations RESP-+ SOB, cough, wheeze ABD- denies N/V, change in stools, abd pain GU- denies dysuria, hematuria, dribbling, incontinence MSK- denies joint pain, muscle aches, injury Neuro- denies headache, dizziness, syncope, seizure activity       Objective:    BP 120/64 mmHg  Pulse 100  Temp(Src) 97.9 F (36.6 C) (Oral)  Resp 22  Ht  (1.575 m)  Wt 97 lb (43.999 kg)  BMI 17.74 kg/m2  SpO2 97% GEN- NAD, alert and oriented x3, chronically ill appearing HEENT- PERRL, EOMI, non injected sclera, pink conjunctiva, MMM, oropharynx clear Neck- Supple, stiff ROM CVS- RRR, no murmur RESP-CTAB,decreased bases, occasional wheeze, 2 L  ABD-NABS,soft,NT,ND EXT- No edema MSK- Decreased ROM spine, Upper and Lower ext Pulses- Radial, DP- 2+ Neuro-CNII-XII in tact, no focal deficits, minimal resting tremor right hand,  Pysch- normal affect and mood        Assessment & Plan:      Problem List Items Addressed This Visit      Unprioritized   Hyperlipidemia   Relevant Orders      Lipid panel (Completed)   COPD (chronic obstructive pulmonary disease) - Primary  Relevant Orders      CBC with Differential (Completed)      Comprehensive metabolic panel (Completed)    Other Visit Diagnoses    Protein-calorie malnutrition        Relevant Orders       TSH (Completed)    Need for prophylactic vaccination against Streptococcus pneumoniae (pneumococcus)        Relevant Orders       Pneumococcal conjugate vaccine 13-valent (Completed)    Need for prophylactic vaccination and inoculation against influenza        Relevant Orders       Flu Vaccine QUAD 36+ mos PF IM (Fluarix Quad PF) (Completed)       Note: This dictation was prepared with Dragon dictation along with smaller phrase technology. Any  transcriptional errors that result from this process are unintentional.

## 2014-12-13 NOTE — Assessment & Plan Note (Signed)
Doing well off meds, will  monitor

## 2014-12-13 NOTE — Assessment & Plan Note (Signed)
Will plan for ensure twice a day, this can be covered with CAP services See if Meals on Wheels Can assist patient

## 2014-12-13 NOTE — Assessment & Plan Note (Signed)
Recheck lipids, no current meds

## 2014-12-14 ENCOUNTER — Telehealth: Payer: Self-pay | Admitting: *Deleted

## 2014-12-14 DIAGNOSIS — Z79899 Other long term (current) drug therapy: Secondary | ICD-10-CM

## 2014-12-14 MED ORDER — ALBUTEROL SULFATE (2.5 MG/3ML) 0.083% IN NEBU: INHALATION_SOLUTION | RESPIRATORY_TRACT | Status: DC

## 2014-12-14 MED ORDER — TIOTROPIUM BROMIDE MONOHYDRATE 18 MCG IN CAPS: 18.0000 ug | ORAL_CAPSULE | Freq: Every day | RESPIRATORY_TRACT | Status: DC

## 2014-12-14 MED ORDER — CYCLOSPORINE 0.05 % OP EMUL: 1.0000 [drp] | Freq: Two times a day (BID) | OPHTHALMIC | Status: DC

## 2014-12-14 MED ORDER — FLUTICASONE-SALMETEROL 230-21 MCG/ACT IN AERO: 1.0000 | INHALATION_SPRAY | Freq: Two times a day (BID) | RESPIRATORY_TRACT | Status: DC

## 2014-12-14 MED ORDER — ALBUTEROL SULFATE HFA 108 (90 BASE) MCG/ACT IN AERS: 2.0000 | INHALATION_SPRAY | RESPIRATORY_TRACT | Status: DC | PRN

## 2014-12-14 NOTE — Telephone Encounter (Signed)
Call placed to multiple O2 providers.   Advised that patient is receiving O2 from [facility]. [facility] states that original order received from MD in 09/2013. Reports that if no changes are needed, then no re-certification is required. Reports that patient just received new tanks in 12/2014.  Requested to have [facility] check patient O2 supplies q 3 months to ensure that she has enough.   FYI: Spoke with Lyla Sonarrie, APS. States that patient did discard expired and discontinued medications per MD request. Reports that Advair was noted in patient home. Patient has (6) inhalers that have not expired at this time.   Also states that PCS forms will be sent to office by CAP Case Worker.

## 2014-12-14 NOTE — Telephone Encounter (Signed)
[facility].

## 2014-12-14 NOTE — Telephone Encounter (Signed)
Call placed to [facility].   Medication prescriptions sent in.   Inquired about O2. Advised that [employer] would most likely be covering O2 supplies.

## 2014-12-14 NOTE — Telephone Encounter (Signed)
-----   Message from Salley ScarletKawanta F [place], MD sent at 12/13/2014  6:20 PM EST ----- Regarding: Things for pt   [facility], SW transportation, food    [facility] see if [facility] are [facility] oxygen, she needs tanks changed out   [facility]

## 2014-12-15 ENCOUNTER — Other Ambulatory Visit: Payer: Self-pay | Admitting: *Deleted

## 2014-12-15 MED ORDER — PRAVASTATIN SODIUM 20 MG PO TABS: 20.0000 mg | ORAL_TABLET | Freq: Every day | ORAL | Status: AC

## 2015-01-03 ENCOUNTER — Telehealth: Payer: Self-pay | Admitting: *Deleted

## 2015-01-03 MED ORDER — UMECLIDINIUM BROMIDE 62.5 MCG/INH IN AEPB: 1.0000 | INHALATION_SPRAY | Freq: Every day | RESPIRATORY_TRACT | Status: AC

## 2015-01-03 NOTE — Telephone Encounter (Signed)
Received letter from insurance.   Advised that Spiriva is not covered.   MD made aware and new orders obtained to changed to Incruse Ellipta.   Prescription sent to pharmacy. Pharmacy made aware to switch inhalers on next month's fill.

## 2015-01-11 ENCOUNTER — Other Ambulatory Visit: Payer: Self-pay | Admitting: *Deleted

## 2015-01-24 ENCOUNTER — Ambulatory Visit: Payer: Medicare Other | Admitting: Family Medicine

## 2015-01-27 ENCOUNTER — Ambulatory Visit (INDEPENDENT_AMBULATORY_CARE_PROVIDER_SITE_OTHER): Payer: Medicare Other | Admitting: Family Medicine

## 2015-01-27 ENCOUNTER — Encounter: Payer: Self-pay | Admitting: Family Medicine

## 2015-01-27 VITALS — BP 118/68 | HR 62 | Temp 97.9°F | Resp 16 | Ht 62.0 in | Wt 94.0 lb

## 2015-01-27 DIAGNOSIS — E46 Unspecified protein-calorie malnutrition: Secondary | ICD-10-CM

## 2015-01-27 DIAGNOSIS — L089 Local infection of the skin and subcutaneous tissue, unspecified: Secondary | ICD-10-CM | POA: Diagnosis not present

## 2015-01-27 DIAGNOSIS — T148 Other injury of unspecified body region: Secondary | ICD-10-CM | POA: Diagnosis not present

## 2015-01-27 DIAGNOSIS — J411 Mucopurulent chronic bronchitis: Secondary | ICD-10-CM | POA: Diagnosis not present

## 2015-01-27 DIAGNOSIS — E785 Hyperlipidemia, unspecified: Secondary | ICD-10-CM | POA: Diagnosis not present

## 2015-01-27 DIAGNOSIS — T148XXA Other injury of unspecified body region, initial encounter: Principal | ICD-10-CM

## 2015-01-27 MED ORDER — SULFAMETHOXAZOLE-TRIMETHOPRIM 400-80 MG PO TABS: 1.0000 | ORAL_TABLET | Freq: Two times a day (BID) | ORAL | Status: DC

## 2015-01-27 NOTE — Assessment & Plan Note (Signed)
At baseline, home oxygen, continue advair, will start incruse

## 2015-01-27 NOTE — Assessment & Plan Note (Addendum)
Plan for bactrim BID x 7 days, [facility] for  dressing changes

## 2015-01-27 NOTE — Progress Notes (Signed)
Patient ID: Christine Garza, female   DOB: [birth year], 80 y.o.   MRN: [id number]   Subjective:    Patient ID: Christine OremJosephine L Boxell, female    DOB: [birth year], 80 y.o.   MRN: [id number]  Patient presents for 6 week F/U and Skin Tear  patient here today with her adopted grandson who has been looking in on her for the past few years. She is here for interim follow-up from her last visit 6 weeks ago. She is currently under Adult Protective Services case. Unfortunately she has not had any insured the liver to her and she does not have an aide at this time. She does have all of her medicines though her grandson states that she has many boxes of albuterol and extra inhalers. She is still on Spiriva though she understands that this will be changed to Incruse with next month because of her insurance. She is also using her Advair and her oxygen. She is now back on pravastatin due to hyperlipidemia. She states that her appetite is good though she has dropped 3 pounds since her last visit  She has a skin tear on her left shin she states about 3 weeks ago she tried to cross her legs and her heel hit her shin it opened up. Her grandson states that they have been bandaging it but noticed that it is had a yellow drainage to it and it is more red around the tear itself. She has not had any fever or chills   Review Of Systems:  GEN- denies fatigue, fever, weight loss,weakness, recent illness HEENT- denies eye drainage, change in vision, nasal discharge, CVS- denies chest pain, palpitations RESP-+SOB, cough, +wheeze ABD- denies N/V, change in stools, abd pain GU- denies dysuria, hematuria, dribbling, incontinence MSK-+joint pain, muscle aches, injury Neuro- denies headache, dizziness, syncope, seizure activity       Objective:    BP 118/68 mmHg  Pulse 62  Temp(Src) 97.9 F (36.6 C) (Oral)  Resp 16  Ht 5\' 2"  (1.575 m)  Wt 94 lb (42.638 kg)  BMI 17.19 kg/m2 GEN- NAD, alert and oriented x3, chronically  ill appearing HEENT- PERRL, EOMI, non injected sclera, pink conjunctiva, MMM, oropharynx clear CVS- RRR, no murmur RESP-CTAB,decreased bases,, 2 L  EXT- No edema Skin 2 x1cm skin tear left shin, mild drainage, no odor, surrounding induration and erythema Pulses- Radial 2+       Assessment & Plan:      Problem List Items Addressed This Visit      Unprioritized   Protein-calorie malnutrition   Infected skin tear - Primary   Hyperlipidemia   COPD (chronic obstructive pulmonary disease)      Note: This dictation was prepared with Dragon dictation along with smaller phrase technology. Any transcriptional errors that result from this process are unintentional.

## 2015-01-27 NOTE — Patient Instructions (Signed)
You will have the new inhaler Incruse instead of Spiriva Continue advair Take antibiotics for your leg We will get Aide to home Ensure twice a day  F/U 3 months

## 2015-01-27 NOTE — Assessment & Plan Note (Signed)
Ensure BID, will have grandson get some OTC, will contact APS she was to have CAP services started Also needs nurse Aide  I discussed with case manager for adult protective services Mrs.  Christine GroomCarrie Friese, this is being processed now

## 2015-01-27 NOTE — Assessment & Plan Note (Signed)
Restarted on statin drugs she is tolerating without any difficulty

## 2015-01-30 ENCOUNTER — Telehealth: Payer: Self-pay | Admitting: Family Medicine

## 2015-01-30 NOTE — Telephone Encounter (Signed)
Christine Garza freeze from health department , calling to see if dr Jeanice Limdurham could fax order saying that patient needs boost or insure for nutritional supplement to  [phone]

## 2015-01-30 NOTE — Telephone Encounter (Signed)
Ok to order BID?

## 2015-01-30 NOTE — Telephone Encounter (Signed)
Okay to order?

## 2015-01-31 NOTE — Telephone Encounter (Signed)
Prescription faxed

## 2015-02-04 ENCOUNTER — Encounter (HOSPITAL_COMMUNITY): Payer: Self-pay | Admitting: *Deleted

## 2015-02-04 ENCOUNTER — Emergency Department (HOSPITAL_COMMUNITY)
Admission: EM | Admit: 2015-02-04 | Discharge: 2015-02-04 | Disposition: A | Discharge status: Home / Self Care | Payer: Medicare Other | Attending: Emergency Medicine | Admitting: Emergency Medicine

## 2015-02-04 ENCOUNTER — Emergency Department (HOSPITAL_COMMUNITY): Payer: Medicare Other

## 2015-02-04 DIAGNOSIS — Z792 Long term (current) use of antibiotics: Secondary | ICD-10-CM | POA: Diagnosis not present

## 2015-02-04 DIAGNOSIS — R0602 Shortness of breath: Secondary | ICD-10-CM | POA: Diagnosis present

## 2015-02-04 DIAGNOSIS — Z86711 Personal history of pulmonary embolism: Secondary | ICD-10-CM | POA: Insufficient documentation

## 2015-02-04 DIAGNOSIS — G4489 Other headache syndrome: Secondary | ICD-10-CM | POA: Insufficient documentation

## 2015-02-04 DIAGNOSIS — Z8619 Personal history of other infectious and parasitic diseases: Secondary | ICD-10-CM | POA: Insufficient documentation

## 2015-02-04 DIAGNOSIS — Z79899 Other long term (current) drug therapy: Secondary | ICD-10-CM | POA: Diagnosis not present

## 2015-02-04 DIAGNOSIS — Z7952 Long term (current) use of systemic steroids: Secondary | ICD-10-CM | POA: Diagnosis not present

## 2015-02-04 DIAGNOSIS — J441 Chronic obstructive pulmonary disease with (acute) exacerbation: Secondary | ICD-10-CM | POA: Diagnosis not present

## 2015-02-04 DIAGNOSIS — Z87891 Personal history of nicotine dependence: Secondary | ICD-10-CM | POA: Insufficient documentation

## 2015-02-04 DIAGNOSIS — Z8739 Personal history of other diseases of the musculoskeletal system and connective tissue: Secondary | ICD-10-CM | POA: Diagnosis not present

## 2015-02-04 DIAGNOSIS — F329 Major depressive disorder, single episode, unspecified: Secondary | ICD-10-CM | POA: Insufficient documentation

## 2015-02-04 LAB — CBC WITH DIFFERENTIAL/PLATELET
BASOS PCT: 1 % (ref 0–1)
Basophils Absolute: 0 10*3/uL (ref 0.0–0.1)
EOS ABS: 0.1 10*3/uL (ref 0.0–0.7)
EOS PCT: 1 % (ref 0–5)
HEMATOCRIT: 39.5 % (ref 36.0–46.0)
HEMOGLOBIN: 13.5 g/dL (ref 12.0–15.0)
Lymphocytes Relative: 17 % (ref 12–46)
Lymphs Abs: 0.9 10*3/uL (ref 0.7–4.0)
MCH: 31.9 pg (ref 26.0–34.0)
MCHC: 34.2 g/dL (ref 30.0–36.0)
MCV: 93.4 fL (ref 78.0–100.0)
Monocytes Absolute: 0.3 10*3/uL (ref 0.1–1.0)
Monocytes Relative: 7 % (ref 3–12)
NEUTROS ABS: 3.7 10*3/uL (ref 1.7–7.7)
Neutrophils Relative %: 74 % (ref 43–77)
Platelets: 239 10*3/uL (ref 150–400)
RBC: 4.23 MIL/uL (ref 3.87–5.11)
RDW: 12.7 % (ref 11.5–15.5)
WBC: 5 10*3/uL (ref 4.0–10.5)

## 2015-02-04 LAB — I-STAT TROPONIN, ED: Troponin i, poc: 0.01 ng/mL (ref 0.00–0.08)

## 2015-02-04 LAB — BASIC METABOLIC PANEL
Anion gap: 4 — ABNORMAL LOW (ref 5–15)
BUN: 18 mg/dL (ref 6–23)
CALCIUM: 9.2 mg/dL (ref 8.4–10.5)
CO2: 30 mmol/L (ref 19–32)
Chloride: 99 mmol/L (ref 96–112)
Creatinine, Ser: 1.36 mg/dL — ABNORMAL HIGH (ref 0.50–1.10)
GFR calc non Af Amer: 36 mL/min — ABNORMAL LOW (ref >90)
GFR, EST AFRICAN AMERICAN: 42 mL/min — AB (ref >90)
Glucose, Bld: 100 mg/dL — ABNORMAL HIGH (ref 70–99)
Potassium: 4.3 mmol/L (ref 3.5–5.1)
Sodium: 133 mmol/L — ABNORMAL LOW (ref 135–145)

## 2015-02-04 LAB — SEDIMENTATION RATE: Sed Rate: 9 mm/hr (ref 0–22)

## 2015-02-04 MED ORDER — ALBUTEROL (5 MG/ML) CONTINUOUS INHALATION SOLN
10.0000 mg/h | INHALATION_SOLUTION | Freq: Once | RESPIRATORY_TRACT | Status: AC
  Administered 2015-02-04: 10 mg/h via RESPIRATORY_TRACT
  Filled 2015-02-04: qty 20

## 2015-02-04 MED ORDER — ALBUTEROL SULFATE HFA 108 (90 BASE) MCG/ACT IN AERS: 1.0000 | INHALATION_SPRAY | Freq: Four times a day (QID) | RESPIRATORY_TRACT | Status: DC | PRN

## 2015-02-04 MED ORDER — ALBUTEROL SULFATE (2.5 MG/3ML) 0.083% IN NEBU
5.0000 mg | INHALATION_SOLUTION | Freq: Once | RESPIRATORY_TRACT | Status: AC
  Administered 2015-02-04: 5 mg via RESPIRATORY_TRACT
  Filled 2015-02-04: qty 6

## 2015-02-04 MED ORDER — PREDNISONE 50 MG PO TABS
60.0000 mg | ORAL_TABLET | Freq: Once | ORAL | Status: AC
  Administered 2015-02-04: 60 mg via ORAL
  Filled 2015-02-04 (×2): qty 1

## 2015-02-04 MED ORDER — PREDNISONE 50 MG PO TABS: ORAL_TABLET | ORAL | Status: DC

## 2015-02-04 MED ORDER — SODIUM CHLORIDE 0.9 % IJ SOLN
INTRAMUSCULAR | Status: AC
  Filled 2015-02-04: qty 600

## 2015-02-04 MED ORDER — IPRATROPIUM-ALBUTEROL 0.5-2.5 (3) MG/3ML IN SOLN
3.0000 mL | Freq: Once | RESPIRATORY_TRACT | Status: AC
  Administered 2015-02-04: 3 mL via RESPIRATORY_TRACT
  Filled 2015-02-04: qty 3

## 2015-02-04 MED ORDER — ALBUTEROL SULFATE (2.5 MG/3ML) 0.083% IN NEBU
2.5000 mg | INHALATION_SOLUTION | Freq: Once | RESPIRATORY_TRACT | Status: AC
  Administered 2015-02-04: 2.5 mg via RESPIRATORY_TRACT
  Filled 2015-02-04: qty 3

## 2015-02-04 NOTE — ED Notes (Signed)
Pt ambulated with 2 person assist. Pt was on 2L, per her norm at home, and oxygen saturation dropped to 93%. Pt does have increased work of breathing. MD made aware.

## 2015-02-04 NOTE — ED Provider Notes (Signed)
CSN: [id number]     Arrival date & time 02/04/15  7829 History  This chart was scribed for Christine Gaskins, MD by Richarda Overlie, ED Scribe. This patient was seen in room APA16A/APA16A and the patient's care was started 9:14 AM.   Chief Complaint  Patient presents with  . Shortness of Breath   Patient is a 80 y.o. female presenting with shortness of breath. The history is provided by the patient. No language interpreter was used.  Shortness of Breath Severity:  Severe Onset quality:  Sudden Timing:  Constant Progression:  Worsening Chronicity:  Recurrent Relieved by:  Nothing Worsened by:  Activity Associated symptoms: headaches   Associated symptoms: no chest pain    HPI Comments: Christine Garza is a 80 y.o. female with a history of emphysema who presents to the Emergency Department complaining of worsening SOB that started last night. She states that her breathing is worse than her baseline. Pt also complains of a sudden right sided HA that started last night as well and describes it as "pounding." She says she has never had a similar HA in the past. She denies any falls or recent head trauma. Pt reports she is on 2L of oxygen therapy at home. She denies CP, abdominal pain and leg swelling at this time.    Past Medical History  Diagnosis Date  . Allergy   . Emphysema of lung   . Osteoporosis   . GERD (gastroesophageal reflux disease)   . Pulmonary nodules/lesions, multiple     2 nodules on left side- most recent in 2012 LLL  . HSV-1 (herpes simplex virus 1) infection     Valtrex prn Cold sores  . Depression   . Cataract     Dr. Gardenia Phlegm [place]  . Dry eye syndrome   . HPV (human papilloma virus) infection     Dr. Despina Hidden s/p lasar ablation  . DDD (degenerative disc disease), lumbar    Past Surgical History  Procedure Laterality Date  . Eye surgery    . Hpv laser removal    . Laser ablation of the cervix      Dr.Eure   Family History  Problem Relation Age of  Onset  . Cancer Mother     liver   History  Substance Use Topics  . Smoking status: Former Smoker    Quit date: 05/09/1996  . Smokeless tobacco: Never Used  . Alcohol Use: No   OB History    Gravida Para Term Preterm AB TAB SAB Ectopic Multiple Living   Review of Systems  Respiratory: Positive for shortness of breath.   Cardiovascular: Negative for chest pain.  Neurological: Positive for headaches. Negative for weakness.  All other systems reviewed and are negative.     Allergies  Baclofen; Hydrocodone; Hydromorphone hcl; Oxycodone; and Cefpodoxime proxetil  Home Medications   Prior to Admission medications   Medication Sig Start Date End Date Taking? Authorizing Provider  albuterol (PROAIR HFA) 108 (90 BASE) MCG/ACT inhaler Inhale 2 puffs into the lungs every 4 (four) hours as needed. For shortness of breath. Dx: J44.9. 12/14/14   Salley Scarlet, MD  albuterol (PROVENTIL) (2.5 MG/3ML) 0.083% nebulizer solution Inhale contents of (1) vial q 6 hours as needed for shortness of breath. Dx: COPD J44.9, Length of Need: Lifetime 12/14/14   Salley Scarlet, MD  cycloSPORINE (RESTASIS) 0.05 % ophthalmic emulsion Place 1 drop into  both eyes 2 (two) times daily. 12/14/14   Salley Scarlet, MD  fluticasone-salmeterol (ADVAIR HFA) [phone] MCG/ACT inhaler Inhale 1 puff into the lungs 2 (two) times daily. 12/14/14   Salley Scarlet, MD  pravastatin (PRAVACHOL) 20 MG tablet Take 1 tablet (20 mg total) by mouth daily. 12/15/14   Salley Scarlet, MD  sulfamethoxazole-trimethoprim (BACTRIM,SEPTRA) 400-80 MG per tablet Take 1 tablet by mouth 2 (two) times daily. 01/27/15   Salley Scarlet, MD  Umeclidinium Bromide (INCRUSE ELLIPTA) 62.5 MCG/INH AEPB Inhale 1 puff into the lungs daily. 01/03/15   Salley Scarlet, MD   BP 129/83 mmHg  Temp(Src) 97.6 F (36.4 C) (Oral)  Resp 22  Ht  (1.575 m)  Wt 98 lb (44.453 kg)  BMI 17.92 kg/m2  SpO2 100% Physical Exam CONSTITUTIONAL:  Elderly and frail. tachypneic HEAD: Normocephalic/atraumatic EYES: EOMI ENMT: Mucous membranes moist NECK: supple no meningeal signs SPINE/BACK:entire spine nontender CV: tachycardic, S1/S2 noted, no murmurs/rubs/gallops noted LUNGS: Decreased breath sounds bilaterally. tachypnea bilaterally.  ABDOMEN: soft, nontender, no rebound or guarding, bowel sounds noted throughout abdomen GU:no cva tenderness NEURO: Pt is awake/alert/appropriate, moves all extremitiesx4.  No facial droop.  No arm or leg drift noted.  EXTREMITIES: pulses normal/equal, full ROM. Healing abrasion to left shin.  SKIN: warm, color normal PSYCH: no abnormalities of mood noted, alert and oriented to situation  ED Course  Procedures   CRITICAL CARE Performed by: Christine Garza Total critical care time: 31 Critical care time was exclusive of separately billable procedures and treating other patients. Critical care was necessary to treat or prevent imminent or life-threatening deterioration. Critical care was time spent personally by me on the following activities: development of treatment plan with patient and/or surrogate as well as nursing, discussions with consultants, evaluation of patient's response to treatment, examination of patient, obtaining history from patient or surrogate, ordering and performing treatments and interventions, ordering and review of laboratory studies, ordering and review of radiographic studies, pulse oximetry and re-evaluation of patient's condition. MULTIPLE NEBULIZED ALBUTEROL/CONTINIOUS TREATMENTS GIVEN FOR COPD EXACERBATION.  PATIENT WAS TACHYPNEIC AND TACHYCARDIC WHILE IN THE ED  DIAGNOSTIC STUDIES: Oxygen Saturation is 100% on [place], normal by my interpretation.    COORDINATION OF CARE: 9:18 AM Discussed treatment plan with pt at bedside and pt agreed to plan. Pt with COPD exacerbation, will treat Also having HA that is unusual for her, given age will obtain CT head She has no focal  neuro deficits 1:15 PM Pt with continued wheeze/SOB Will give continuous nebulizer 3:22 PM Pt reports improvement She would like to go home Her work of breathing is improvement She is tachycardic likely due to albuterol (doubt PE) She denies CP She reports her HA is improved and no focal weakness to suggest acute CVA.  No elevation in her sed rate I suggested admission as patient is high risk for worsening due to severe COPD She prefers to go home - "my house is unlocked and I need to get home" I advised need to continue albuterol at home q4 hrs Also advised daily prednisone We discussed strict return precautions Labs Review Labs Reviewed  BASIC METABOLIC PANEL - Abnormal; Notable for the following:    Sodium 133 (*)    Glucose, Bld 100 (*)    Creatinine, Ser 1.36 (*)    GFR calc non Af Amer 36 (*)    GFR calc Af Amer 42 (*)    Anion gap 4 (*)    All other components  within normal limits  CBC WITH DIFFERENTIAL/PLATELET  SEDIMENTATION RATE  I-STAT TROPOININ, ED    Imaging Review Ct Head Wo Contrast  02/04/2015   CLINICAL DATA:  Right-sided headaches for 1 day  EXAM: CT HEAD WITHOUT CONTRAST  TECHNIQUE: Contiguous axial images were obtained from the base of the skull through the vertex without intravenous contrast.  COMPARISON:  06/15/2012  FINDINGS: The bony calvarium is intact. No gross soft tissue abnormality is noted. Paranasal sinuses as visualized are within normal limits. Mild atrophic changes are noted commensurate with the patient's given age. Very decreased attenuation is noted in the left frontal parietal region consistent with prior infarct. No acute hemorrhage, acute infarction or space-occupying mass lesion is identified.  IMPRESSION: Atrophic and ischemic change without acute abnormality.   Electronically Signed   By: Alcide CleverMark  Lukens M.D.   On: 02/04/2015 11:31   Dg Chest Portable 1 View  02/04/2015   CLINICAL DATA:  Shortness of breath.  History of COPD.  EXAM: PORTABLE  CHEST - 1 VIEW  COMPARISON:  07/05/2012  FINDINGS: Advanced COPD changes again noted. Scout ring in the apices bilaterally, stable. No acute airspace opacities or effusions. Heart is normal size. Mediastinal contours are within normal limits.  IMPRESSION: COPD/chronic changes.  No active disease.   Electronically Signed   By: Charlett NoseKevin  Dover M.D.   On: 02/04/2015 09:53     EKG Interpretation   Date/Time:  Saturday February 04 2015 10:37:17 EST Ventricular Rate:  97 PR Interval:  144 QRS Duration: 116 QT Interval:  368 QTC Calculation: 467 R Axis:   104 Text Interpretation:  Sinus rhythm Right atrial enlargement IRBBB and LPFB  Low voltage, precordial leads Probable anteroseptal infarct, old No  significant change since last tracing Confirmed by Bebe ShaggyWICKLINE  MD, Sion Thane  [phone]([id number]) on 02/04/2015 11:06:34 AM     Medications  sodium chloride 0.9 % injection (not administered)  ipratropium-albuterol (DUONEB) 0.5-2.5 (3) MG/3ML nebulizer solution 3 mL (3 mLs Nebulization Given 02/04/15 0957)  albuterol (PROVENTIL) (2.5 MG/3ML) 0.083% nebulizer solution 2.5 mg (2.5 mg Nebulization Given 02/04/15 0957)  predniSONE (DELTASONE) tablet 60 mg (60 mg Oral Given 02/04/15 1029)  albuterol (PROVENTIL) (2.5 MG/3ML) 0.083% nebulizer solution 5 mg (5 mg Nebulization Given 02/04/15 1111)  albuterol (PROVENTIL,VENTOLIN) solution continuous neb (10 mg/hr Nebulization Given 02/04/15 1328)    MDM   Final diagnoses:  Chronic obstructive pulmonary disease with acute exacerbation  Other headache syndrome    Nursing notes including past medical history and social history reviewed and considered in documentation xrays/imaging reviewed by myself and considered during evaluation Labs/vital reviewed myself and considered during evaluation   I personally performed the services described in this documentation, which was scribed in my presence. The recorded information has been reviewed and is accurate.       Christine Gaskinsonald W  Jevaeh Shams, MD 02/04/15 [phone]

## 2015-02-04 NOTE — Discharge Instructions (Signed)

## 2015-02-04 NOTE — ED Notes (Signed)
RT at bedside.

## 2015-02-04 NOTE — ED Notes (Signed)
Family member on the way to take pt home.

## 2015-02-04 NOTE — ED Notes (Signed)
EMS called out for shortness of breath and right sided headache; pt. Wears home O2 at 2L but EMS states oxygen was not on when they arrived and heat was not on; pt. Seems anxious and stated to EMS that she has a lot going on last night

## 2015-02-06 ENCOUNTER — Telehealth: Payer: Self-pay | Admitting: Family Medicine

## 2015-02-06 NOTE — Telephone Encounter (Signed)
-----   Message from Durwin Norahristina H Six, LPN sent at 9/60/45402/29/2016 11:26 AM EST ----- Regarding: RE: Call and check on pt, seen in ED COPD excerbation, get OV if needed Call placed to patient.   States that she felt SOB so she called EMS and went to ER.   States that she feels ok now and does not think she needs F/U with MD. Algis DownsAdvised that she can contact office with any concern.  ----- Message -----    From: Salley ScarletKawanta F Pelican, MD    Sent: 02/06/2015   7:54 AM      To: Durwin Norahristina H Six, LPN Subject: Call and check on pt, seen in ED COPD excerb#

## 2015-02-20 DIAGNOSIS — S80922D Unspecified superficial injury of left lower leg, subsequent encounter: Secondary | ICD-10-CM

## 2015-02-20 DIAGNOSIS — J449 Chronic obstructive pulmonary disease, unspecified: Secondary | ICD-10-CM

## 2015-02-20 DIAGNOSIS — E785 Hyperlipidemia, unspecified: Secondary | ICD-10-CM | POA: Diagnosis not present

## 2015-02-20 DIAGNOSIS — E46 Unspecified protein-calorie malnutrition: Secondary | ICD-10-CM | POA: Diagnosis not present

## 2015-03-01 ENCOUNTER — Other Ambulatory Visit: Payer: Self-pay | Admitting: *Deleted

## 2015-03-06 ENCOUNTER — Other Ambulatory Visit: Payer: Self-pay | Admitting: *Deleted

## 2015-03-06 NOTE — Patient Outreach (Signed)
I spoke with Christine Garza today by phone. We rescheduled a home visit and I will see her on Wednesday. [facility] for community resource assessment and consideration of options for level of care. Christine Garza, at this time, does not wish to leave her home and move to a higher level of care. Her HCPOA Christine Garza agrees that Christine Garza should be allowed to make her own decision about level of care at this time. Christine Garza helps with home management and finances. Christine Garza is able to perform ADL's independently. There is concern that she is able to safely self administer medications and I have reviewed this with her during visits as has the Home Health nurse from [facility].

## 2015-03-08 ENCOUNTER — Other Ambulatory Visit: Payer: Self-pay | Admitting: *Deleted

## 2015-03-08 NOTE — Patient Outreach (Signed)
[facility]) Care Management   03/08/2015  Christine Garza [birth year] [id number]  Christine Garza is an 80 y.o. female  Subjective: "I'm having trouble with bathroom problems today."  Objective:   Review of Systems  Constitutional: Negative.   HENT: Negative.   Eyes: Negative.   Respiratory: Negative.   Cardiovascular: Negative.   Gastrointestinal: Negative.   Genitourinary: Negative.   Musculoskeletal: Negative.   Skin: Negative.   Neurological: Negative.   Endo/Heme/Allergies: Negative.   Psychiatric/Behavioral: Positive for memory loss.    Physical Exam  Current Medications:   Current Outpatient Prescriptions  Medication Sig Dispense Refill  . albuterol (PROAIR HFA) 108 (90 BASE) MCG/ACT inhaler Inhale 2 puffs into the lungs every 4 (four) hours as needed. For shortness of breath. Dx: J44.9. 1 Inhaler 3  . albuterol (PROVENTIL HFA;VENTOLIN HFA) 108 (90 BASE) MCG/ACT inhaler Inhale 1-2 puffs into the lungs every 6 (six) hours as needed for wheezing or shortness of breath. 1 Inhaler 0  . albuterol (PROVENTIL) (2.5 MG/3ML) 0.083% nebulizer solution Inhale contents of (1) vial q 6 hours as needed for shortness of breath. Dx: COPD J44.9, Length of Need: Lifetime 75 mL 3  . cycloSPORINE (RESTASIS) 0.05 % ophthalmic emulsion Place 1 drop into both eyes 2 (two) times daily. 1 each 3  . fluticasone-salmeterol (ADVAIR HFA) 230-21 MCG/ACT inhaler Inhale 1 puff into the lungs 2 (two) times daily. 1 Inhaler 6  . pravastatin (PRAVACHOL) 20 MG tablet Take 1 tablet (20 mg total) by mouth daily. 90 tablet 3  . predniSONE (DELTASONE) 50 MG tablet ONE TABLET PO DAILY FOR 4 DAYS 4 tablet 0  . sulfamethoxazole-trimethoprim (BACTRIM,SEPTRA) 400-80 MG per tablet Take 1 tablet by mouth 2 (two) times daily. 14 tablet 0  . Umeclidinium Bromide (INCRUSE ELLIPTA) 62.5 MCG/INH AEPB Inhale 1 puff into the lungs daily. 1 each 11   No current facility-administered medications for  this visit.    Functional Status:   In your present state of health, do you have any difficulty performing the following activities: 03/06/2015  Is the patient deaf or have difficulty hearing? N  Hearing N  Vision Y  Difficulty concentrating or making decisions Y  Walking or climbing stairs? N  Doing errands, shopping? Y  Preparing Food and eating ? N  Using the Toilet? N  In the past six months, have you accidently leaked urine? N  Do you have problems with loss of bowel control? N  Managing your Medications? Y  Managing your Finances? N  Housekeeping or managing your Housekeeping? N    Fall/Depression Screening:    PHQ 2/9 Scores 03/06/2015 12/13/2014  PHQ - 2 Score 0 2  PHQ- 9 Score - 3  Exception Documentation (No Data) -    Assessment:    Chronic Health Condition (COPD) - Christine Garza has dyspnea on exertion; she does not have shortness of breath at rest; she is on O2 continuously @  3l/[place]; she is not adherent with her medication regimen 100% of the time and has multiple bottles of medications and numerous inhalers. I've encouraged her on a few visits to dispose of unused medications and asked if I can help her by calling her pharmacy and asking them to hold on sending more inhalers until she has used the in date ones she has but she declines stating she likes having them on hand.    Home Safety/Level of Care Issues - Christine Garza has chosen to continue to live at home alone;  she now has a CAP worker from 9am-1pm which she says helps her considerably; she is grateful also for the company she says; Christine Garza ambulates about the house with her walker sometimes but generally holds to furniture for balance; we have discussed using her walker rather than furniture for balance or support but she says she would rather not use the walker in the house; she does use her walker when she goes out/leaves the home; Christine Garza's O2 tubing is long and coiled up in the floor near her wherever she  goes; I asked how she keeps from tripping on it and she says "Its training."  APS evaluated Christine Garza in January and determined that she has capacity to make decisions for herself and does not pose danger or a threat to herself or others. Her APS case is closed.    Plan:  I will discharge Christine Garza from active status as there are currently no case management interventions needed. However, [facility] is happy to open Christine Garza's case again at any time in the future should needs arise.    Marja Kayslisa Taler Kushner MHA,BSN,RN,CCM [facility]  [phone]

## 2015-03-15 ENCOUNTER — Telehealth: Payer: Self-pay | Admitting: *Deleted

## 2015-03-15 NOTE — Telephone Encounter (Signed)
Ok to order 

## 2015-03-15 NOTE — Telephone Encounter (Signed)
Order faxed.

## 2015-03-15 NOTE — Telephone Encounter (Signed)
Received call from CAP program- Rodell PernaHolly Johnson stating that pt is not able to take baths with what she has, Aide is having trouble bathing her, states needs written order for pt to have a ParamedicBath transfer Bench and a hand held shower. Please fax to [phone]

## 2015-03-15 NOTE — Telephone Encounter (Signed)
Okay 

## 2015-03-22 ENCOUNTER — Other Ambulatory Visit: Payer: Self-pay | Admitting: *Deleted

## 2015-03-22 MED ORDER — AZELASTINE HCL 0.1 % NA SOLN: 2.0000 | Freq: Two times a day (BID) | NASAL | Status: AC

## 2015-03-22 NOTE — Telephone Encounter (Signed)
Received call from pharmacy.   Reports that patient is requesting refill on Astelin nasal spray.   Refill appropriate and filled per protocol.

## 2015-03-29 ENCOUNTER — Telehealth: Payer: Self-pay | Admitting: Family Medicine

## 2015-03-29 NOTE — Telephone Encounter (Signed)
Call placed to patient.   Reports that she woke up this morning with slight edema to B ankles. States that she does have som pain while standing for extended periods of time.   Reports that she does not "trust" the new friends her grandson have gotten in the midst of, and she does not want him in her home.   Reports that she has no way of coming to office.

## 2015-03-29 NOTE — Telephone Encounter (Signed)
Patient is calling to get a medication if possible for swollen ankles, I let her know she would need to come in for this, because dr Jeanice Limdurham had not seen her for this. She said she had not ride here, that her grandson was "missing" and that "carrie" could not bring her anymore. Please call her at [phone]

## 2015-03-29 NOTE — Telephone Encounter (Signed)
Send in HCTZ 12.5mg  once a day as needed for leg swelling,wear compression hose if she has any She needs to find a way for an appt Otherwise if it gets worse go to ER

## 2015-03-30 MED ORDER — HYDROCHLOROTHIAZIDE 12.5 MG PO TABS: 12.5000 mg | ORAL_TABLET | Freq: Every day | ORAL | Status: DC | PRN

## 2015-03-30 NOTE — Telephone Encounter (Signed)
Prescription sent to pharmacy.  Call placed to patient. Line noted busy.  

## 2015-03-31 NOTE — Telephone Encounter (Signed)
Call placed to patient and patient made aware.   Patient states that she began taking HCTZ as needed and her edema is relieved.   States that she is still attempting to find a ride to come to office for visit.

## 2015-04-04 NOTE — Patient Outreach (Signed)
[facility]) Care Management  04/04/2015  Christine Garza Mar 09, 1935 [id number]   Received notification from Janalyn Shy, RN to close case due to goals met.  Christine Garza. [place] CM Assistant Phone: [phone] Fax: [phone]

## 2015-04-07 ENCOUNTER — Telehealth: Payer: Self-pay | Admitting: Family Medicine

## 2015-04-07 NOTE — Telephone Encounter (Signed)
MD please advise

## 2015-04-07 NOTE — Telephone Encounter (Signed)
Patient is calling to get order for liquid oxygen  To go to [facility]  [phone] if any questions

## 2015-04-10 NOTE — Telephone Encounter (Signed)
She needs to use the oxygen she has, I am not sure what she is requesting, does she want portable tanks,

## 2015-04-11 NOTE — Telephone Encounter (Signed)
Call placed to patient to inquire.   Reports that she ran out of portable O2, and forgot to call to have more delivered.   States that she required portable O2 to go to hair salon to have her hair cut.   [facility]. Was advised that patient does have liquid oxygen, and she is on schedule to have oxygen delivered on Friday.   Call placed to patient to make aware.

## 2015-04-21 ENCOUNTER — Other Ambulatory Visit: Payer: Self-pay | Admitting: Family Medicine

## 2015-04-21 NOTE — Telephone Encounter (Signed)
Refill appropriate and filled per protocol. 

## 2015-04-25 ENCOUNTER — Other Ambulatory Visit: Payer: Self-pay | Admitting: Family Medicine

## 2015-04-25 NOTE — Telephone Encounter (Signed)
Medication refill per protocol °

## 2015-05-02 ENCOUNTER — Encounter: Payer: Self-pay | Admitting: Family Medicine

## 2015-05-02 ENCOUNTER — Ambulatory Visit (INDEPENDENT_AMBULATORY_CARE_PROVIDER_SITE_OTHER): Payer: Medicare Other | Admitting: Family Medicine

## 2015-05-02 VITALS — BP 128/68 | HR 90 | Temp 97.6°F | Resp 20 | Ht 62.0 in | Wt 105.0 lb

## 2015-05-02 DIAGNOSIS — E46 Unspecified protein-calorie malnutrition: Secondary | ICD-10-CM | POA: Diagnosis not present

## 2015-05-02 DIAGNOSIS — M150 Primary generalized (osteo)arthritis: Secondary | ICD-10-CM | POA: Diagnosis not present

## 2015-05-02 DIAGNOSIS — L729 Follicular cyst of the skin and subcutaneous tissue, unspecified: Secondary | ICD-10-CM

## 2015-05-02 DIAGNOSIS — M8949 Other hypertrophic osteoarthropathy, multiple sites: Secondary | ICD-10-CM

## 2015-05-02 DIAGNOSIS — J411 Mucopurulent chronic bronchitis: Secondary | ICD-10-CM | POA: Diagnosis not present

## 2015-05-02 DIAGNOSIS — M159 Polyosteoarthritis, unspecified: Secondary | ICD-10-CM

## 2015-05-02 DIAGNOSIS — E785 Hyperlipidemia, unspecified: Secondary | ICD-10-CM

## 2015-05-02 LAB — COMPREHENSIVE METABOLIC PANEL
ALT: 19 U/L (ref 0–35)
AST: 27 U/L (ref 0–37)
Albumin: 4.3 g/dL (ref 3.5–5.2)
Alkaline Phosphatase: 89 U/L (ref 39–117)
BILIRUBIN TOTAL: 0.4 mg/dL (ref 0.2–1.2)
BUN: 15 mg/dL (ref 6–23)
CO2: 29 mEq/L (ref 19–32)
Calcium: 9.8 mg/dL (ref 8.4–10.5)
Chloride: 98 mEq/L (ref 96–112)
Creat: 1.02 mg/dL (ref 0.50–1.10)
GLUCOSE: 83 mg/dL (ref 70–99)
Potassium: 4.6 mEq/L (ref 3.5–5.3)
SODIUM: 137 meq/L (ref 135–145)
TOTAL PROTEIN: 6.5 g/dL (ref 6.0–8.3)

## 2015-05-02 LAB — CBC WITH DIFFERENTIAL/PLATELET
Basophils Absolute: 0.1 10*3/uL (ref 0.0–0.1)
Basophils Relative: 1 % (ref 0–1)
EOS ABS: 0.1 10*3/uL (ref 0.0–0.7)
Eosinophils Relative: 1 % (ref 0–5)
HCT: 39.1 % (ref 36.0–46.0)
HEMOGLOBIN: 13.1 g/dL (ref 12.0–15.0)
LYMPHS PCT: 19 % (ref 12–46)
Lymphs Abs: 1 10*3/uL (ref 0.7–4.0)
MCH: 31.3 pg (ref 26.0–34.0)
MCHC: 33.5 g/dL (ref 30.0–36.0)
MCV: 93.5 fL (ref 78.0–100.0)
MPV: 9.3 fL (ref 8.6–12.4)
Monocytes Absolute: 0.5 10*3/uL (ref 0.1–1.0)
Monocytes Relative: 9 % (ref 3–12)
NEUTROS PCT: 70 % (ref 43–77)
Neutro Abs: 3.6 10*3/uL (ref 1.7–7.7)
Platelets: 235 10*3/uL (ref 150–400)
RBC: 4.18 MIL/uL (ref 3.87–5.11)
RDW: 13.4 % (ref 11.5–15.5)
WBC: 5.1 10*3/uL (ref 4.0–10.5)

## 2015-05-02 LAB — LIPID PANEL
Cholesterol: 211 mg/dL — ABNORMAL HIGH (ref 0–200)
HDL: 101 mg/dL (ref >=46)
LDL CALC: 93 mg/dL (ref 0–99)
Total CHOL/HDL Ratio: 2.1 Ratio
Triglycerides: 84 mg/dL (ref <150)
VLDL: 17 mg/dL (ref 0–40)

## 2015-05-02 NOTE — Assessment & Plan Note (Signed)
Discussed proper use of her inhalers which she has not been using on a regular basis. She is also on oxygen therapy.

## 2015-05-02 NOTE — Patient Instructions (Signed)
Take tylenol twice a day Use antibiotic ointment on leg- call for any signs of infection Drink ensure at least once a day  Use your inhalers as prescribed F/U 4 months

## 2015-05-02 NOTE — Progress Notes (Signed)
Patient ID: Christine Garza, female   DOB: February 14, 1935, 80 y.o.   MRN: [id number]   Subjective:    Patient ID: Christine Garza, female    DOB: 06-Jun-1935, 80 y.o.   MRN: [id number]  Patient presents for 3 month F/U and Hard Knot to Lower L Leg  patient here to follow-up. She is here today with her grandson who does look in on her. She does have an aide who it therefore hours a day during the daytime to assist her. She has gained some weight since her last visit she states that she is eating but she is not drinking her Ensure all the time. She we have had multiple phone calls which we also which we addressed Maurice March states that he was not aware that she was calling our office. At one point she has significant leg swelling up with her on low-dose HCTZ 12.5 mg she took this for couple weeks and then she stopped when the leg swelling went down. She states that she noticed she has not had the medication in the past week. She states she is using her breathing medications as prescribed but we have noted that she has multiple inhalers at home. She now has her oxygen refilled and typically wears 2-3 L. She denies any cough or change in shortness of breath.  She does complain of neck pain for the past 2 days which resulted in a headache this was resolved with Tylenol. She has known spondylosis in her spine as well as history of for T1 fracture and arthritis. She denies any new paresthesias of upper extremities. She denies any recent falls.  She has a knot on her left shin that has been present for the past 2 weeks it is tender to palpation. She has not had any drainage from it.    Review Of Systems:  GEN- denies fatigue, fever, weight loss,weakness, recent illness HEENT- denies eye drainage, change in vision, nasal discharge, CVS- denies chest pain, palpitations RESP- denies SOB, cough, wheeze ABD- denies N/V, change in stools, abd pain GU- denies dysuria, hematuria, dribbling, incontinence MSK- + joint  pain, muscle aches, injury Neuro- denies headache, dizziness, syncope, seizure activity       Objective:    BP 128/68 mmHg  Pulse 90  Temp(Src) 97.6 F (36.4 C) (Oral)  Resp 20  Ht  (1.575 m)  Wt 105 lb (47.628 kg)  BMI 19.20 kg/m2  SpO2 93% GEN- NAD, alert and oriented x3,chronically ill appearing HEENT- PERRL, EOMI, non injected sclera, pink conjunctiva, MMM, oropharynx clear Neck- Supple,decreased ROM, spine NT, +spasm on right SCM, no LAD CVS- RRR, no murmur RESP-Clear, decreased at bases on 2.5L ABD-NABS,soft,NT,ND EXT- No edema Skin- bruising bilat lower ext, no skin tears, small cystic lesion on left shin  Pulses- Radial, DP- 1+  Procedure- Incision and Drainage Procedure explained to patient questions answered benefits and risks discussed verbal consent obtained. Antiseptic-Betadine Anesthesia-lidocaine 2% with Epi  Incision performed cheesy white cystic substance removed with lining Minimal blood loss Patient tolerated procedure well Bandage applied       Assessment & Plan:      Problem List Items Addressed This Visit    Protein-calorie malnutrition - Primary     He has gained some weight since her last visit. Had been an 8 at home is helping however she would benefit from higher level care but refuses. Her grandson is here today who does look in on her. I want her to drink ensure every  day if she agrees to this.      Relevant Orders   CBC with Differential/Platelet   Comprehensive metabolic panel   OA (osteoarthritis)     We'll have her use Tylenol arthritis for any pain I'm concerned about anything stronger causing sedation and increasing falls      Hyperlipidemia     Recheck lipid panel she is on statin drug      Relevant Orders   Lipid panel   COPD (chronic obstructive pulmonary disease)     Discussed proper use of her inhalers which she has not been using on a regular basis. She is also on oxygen therapy.       Other Visit Diagnoses     Cyst of skin        s/p I and D, no sign of infection, small superficial lesion, no sutures required       Note: This dictation was prepared with Dragon dictation along with smaller phrase technology. Any transcriptional errors that result from this process are unintentional.

## 2015-05-02 NOTE — Assessment & Plan Note (Signed)
We'll have her use Tylenol arthritis for any pain I'm concerned about anything stronger causing sedation and increasing falls

## 2015-05-02 NOTE — Assessment & Plan Note (Signed)
He has gained some weight since her last visit. Had been an 8 at home is helping however she would benefit from higher level care but refuses. Her grandson is here today who does look in on her. I want her to drink ensure every day if she agrees to this.

## 2015-05-02 NOTE — Assessment & Plan Note (Signed)
Recheck lipid panel she is on statin drug

## 2015-05-22 ENCOUNTER — Other Ambulatory Visit: Payer: Self-pay | Admitting: Family Medicine

## 2015-05-22 NOTE — Telephone Encounter (Signed)
Med refilled.

## 2015-08-28 ENCOUNTER — Telehealth: Payer: Self-pay | Admitting: Family Medicine

## 2015-08-28 NOTE — Telephone Encounter (Signed)
Call placed to patient.   States that she was having difficulty getting inhaler from [facility].   Advised that prescription was sent to [facility]. States that [facility] has brought out inhaler for her.

## 2015-08-28 NOTE — Telephone Encounter (Signed)
Patient says she is having trouble getting her incruse and would like a call back regarding this  [phone]

## 2015-09-05 ENCOUNTER — Ambulatory Visit: Payer: Medicare Other | Admitting: Family Medicine

## 2015-10-27 ENCOUNTER — Ambulatory Visit: Payer: Medicare Other | Admitting: Family Medicine

## 2015-11-01 ENCOUNTER — Ambulatory Visit (INDEPENDENT_AMBULATORY_CARE_PROVIDER_SITE_OTHER): Payer: Medicare Other | Admitting: Family Medicine

## 2015-11-01 VITALS — BP 138/76 | HR 82 | Temp 97.8°F | Resp 18 | Ht 62.0 in | Wt 110.0 lb

## 2015-11-01 DIAGNOSIS — J411 Mucopurulent chronic bronchitis: Secondary | ICD-10-CM | POA: Diagnosis not present

## 2015-11-01 DIAGNOSIS — Z23 Encounter for immunization: Secondary | ICD-10-CM | POA: Diagnosis not present

## 2015-11-01 DIAGNOSIS — F0390 Unspecified dementia without behavioral disturbance: Secondary | ICD-10-CM

## 2015-11-01 DIAGNOSIS — E785 Hyperlipidemia, unspecified: Secondary | ICD-10-CM | POA: Diagnosis not present

## 2015-11-01 LAB — LIPID PANEL
Cholesterol: 198 mg/dL (ref 125–200)
HDL: 81 mg/dL (ref >=46)
LDL CALC: 95 mg/dL (ref <130)
Total CHOL/HDL Ratio: 2.4 Ratio (ref <=5.0)
Triglycerides: 112 mg/dL (ref <150)
VLDL: 22 mg/dL (ref <30)

## 2015-11-01 LAB — CBC WITH DIFFERENTIAL/PLATELET
BASOS ABS: 0.1 10*3/uL (ref 0.0–0.1)
Basophils Relative: 1 % (ref 0–1)
EOS ABS: 0.1 10*3/uL (ref 0.0–0.7)
Eosinophils Relative: 2 % (ref 0–5)
HCT: 38.7 % (ref 36.0–46.0)
HEMOGLOBIN: 13.4 g/dL (ref 12.0–15.0)
Lymphocytes Relative: 17 % (ref 12–46)
Lymphs Abs: 1.1 10*3/uL (ref 0.7–4.0)
MCH: 31.9 pg (ref 26.0–34.0)
MCHC: 34.6 g/dL (ref 30.0–36.0)
MCV: 92.1 fL (ref 78.0–100.0)
MONOS PCT: 8 % (ref 3–12)
MPV: 9.7 fL (ref 8.6–12.4)
Monocytes Absolute: 0.5 10*3/uL (ref 0.1–1.0)
NEUTROS ABS: 4.8 10*3/uL (ref 1.7–7.7)
NEUTROS PCT: 72 % (ref 43–77)
PLATELETS: 270 10*3/uL (ref 150–400)
RBC: 4.2 MIL/uL (ref 3.87–5.11)
RDW: 13.5 % (ref 11.5–15.5)
WBC: 6.7 10*3/uL (ref 4.0–10.5)

## 2015-11-01 LAB — COMPREHENSIVE METABOLIC PANEL
ALBUMIN: 4.2 g/dL (ref 3.6–5.1)
ALK PHOS: 78 U/L (ref 33–130)
ALT: 10 U/L (ref 6–29)
AST: 21 U/L (ref 10–35)
BUN: 21 mg/dL (ref 7–25)
CHLORIDE: 99 mmol/L (ref 98–110)
CO2: 29 mmol/L (ref 20–31)
CREATININE: 1.04 mg/dL — AB (ref 0.60–0.93)
Calcium: 9.7 mg/dL (ref 8.6–10.4)
Glucose, Bld: 89 mg/dL (ref 70–99)
Potassium: 4.6 mmol/L (ref 3.5–5.3)
Sodium: 137 mmol/L (ref 135–146)
TOTAL PROTEIN: 6.9 g/dL (ref 6.1–8.1)
Total Bilirubin: 0.4 mg/dL (ref 0.2–1.2)

## 2015-11-01 LAB — VITAMIN B12: VITAMIN B 12: 471 pg/mL (ref 211–911)

## 2015-11-01 MED ORDER — DONEPEZIL HCL 5 MG PO TABS: 5.0000 mg | ORAL_TABLET | Freq: Every day | ORAL | Status: AC

## 2015-11-01 NOTE — Patient Instructions (Signed)
Aricept at bedtime F/U 3 months

## 2015-11-01 NOTE — Progress Notes (Signed)
Patient ID: Christine OremJosephine L Garza, female   DOB: 02-07-1935, 80 y.o.   MRN: [id number]   Subjective:    Patient ID: Christine OremJosephine L Garza, female    DOB: 02-07-1935, 80 y.o.   MRN: [id number]  Patient presents for F/U  history here for follow-up. She is here with her grandson Christine Garza. There is concern about her memory. She is has significant memory changes over the past couple years but the past 2 months of them worse. She does not one any of the aides in her home. She's had 4 different aids. She can never remember the name she states they're stealing from her and they're not feeding her but this is not the case. They have been trying to get her omitted into a skilled nursing facility but the social worker cannot do anything and she does not have any diagnosis to support her incompetence. Her grandson is unable to take care of her full-time. She's not had any falls and her weight is actually up 5 pounds but her mental status is worsening. She seems to be anxious all the time. She states that he she will call him for very for villous stains such as losing stuff in the house. While she is improved with taking her breathing medication she is still missing days with her inhalers as well as her cholesterol medication.   Her caseworker is Christine LimHolly [phone]    Review Of Systems:  GEN- denies fatigue, fever, weight loss,weakness, recent illness HEENT- denies eye drainage, change in vision, nasal discharge, CVS- denies chest pain, palpitations RESP- denies SOB, cough, wheeze ABD- denies N/V, change in stools, abd pain GU- denies dysuria, hematuria, dribbling, incontinence MSK- denies joint pain, muscle aches, injury Neuro- denies headache, dizziness, syncope, seizure activity       Objective:    BP 138/76 mmHg  Pulse 82  Temp(Src) 97.8 F (36.6 C) (Oral)  Resp 18  Ht 5\' 2"  (1.575 m)  Wt 110 lb (49.896 kg)  BMI 20.11 kg/m2  SpO2 96% GEN- NAD, alert and oriented x person, place, general time of  day HEENT- PERRL, EOMI, non injected sclera, pink conjunctiva, MMM, oropharynx clear Neck- Supple, no thyromegaly CVS- RRR, no murmur RESP-CTAB ABD-NABS,soft,NT,ND Psych- pleasant, did not call any names correctly, not depressed or anxious EXT- No edema Pulses- Radial, DP- 2+   MMSE 21/30     Assessment & Plan:      Problem List Items Addressed This Visit    Hyperlipidemia   Relevant Orders   Lipid panel (Completed)   COPD (chronic obstructive pulmonary disease) (HCC)    Currently stable Flu shot given Continue inhalers       Other Visit Diagnoses    Dementia, without behavioral disturbance    -  Primary    Concern for decline, I think she has been home alone that she has been able to cover up her mental changes, we started services the behavior changes/confusion has been more evident. She needs 24 hour care though she refuses but I dont think she can make sound choices for herself. Will start FL2, contact her SW. Start aricept at bedtime. Christine PriceGrandson Christine Garza will monitor her as much as he can until we get things set up    Relevant Medications    donepezil (ARICEPT) 5 MG tablet    Other Relevant Orders    CBC with Differential/Platelet (Completed)    Comprehensive metabolic panel (Completed)    Vitamin B12 (Completed)    RPR (Completed)    Need  for prophylactic vaccination and inoculation against influenza        Relevant Orders    Flu Vaccine QUAD 36+ mos PF IM (Fluarix & Fluzone Quad PF) (Completed)       Note: This dictation was prepared with Dragon dictation along with smaller phrase technology. Any transcriptional errors that result from this process are unintentional.

## 2015-11-02 ENCOUNTER — Encounter: Payer: Self-pay | Admitting: Family Medicine

## 2015-11-02 LAB — RPR

## 2015-11-02 NOTE — Assessment & Plan Note (Signed)
Currently stable Flu shot given Continue inhalers

## 2015-11-06 ENCOUNTER — Encounter: Payer: Self-pay | Admitting: *Deleted

## 2015-11-21 ENCOUNTER — Telehealth: Payer: Self-pay | Admitting: *Deleted

## 2015-11-21 NOTE — Telephone Encounter (Signed)
Call and speak to pt, advise if Aide is not in her home then she will lose her services and this is why we need someone there. If she doesn't have an aide then she will have to go to ALF

## 2015-11-21 NOTE — Telephone Encounter (Signed)
Call placed to patient and patient made aware.   Patient became really upset and states that she know patient grandson is behind this, but she will let Aide in the next day.   Call placed to patient grandson Maurice MarchLane. Advised of conversation with patient.

## 2015-11-21 NOTE — Telephone Encounter (Signed)
[place] with CAP services.   States that [facility].   Advised that if patient continues to refuse HH Aide >30days, CAP services will be discontinued. If CAP services are stopped, then patient will not continue to receive Ensure or products.   MD to be made aware.

## 2015-12-13 ENCOUNTER — Telehealth: Payer: Self-pay | Admitting: Family Medicine

## 2015-12-13 ENCOUNTER — Encounter (HOSPITAL_COMMUNITY): Payer: Self-pay | Admitting: Emergency Medicine

## 2015-12-13 ENCOUNTER — Emergency Department (HOSPITAL_COMMUNITY)
Admission: EM | Admit: 2015-12-13 | Discharge: 2015-12-14 | Disposition: A | Discharge status: Home / Self Care | Payer: Medicare Other | Attending: Emergency Medicine | Admitting: Emergency Medicine

## 2015-12-13 DIAGNOSIS — Z87891 Personal history of nicotine dependence: Secondary | ICD-10-CM | POA: Diagnosis not present

## 2015-12-13 DIAGNOSIS — R440 Auditory hallucinations: Secondary | ICD-10-CM | POA: Diagnosis not present

## 2015-12-13 DIAGNOSIS — Z7951 Long term (current) use of inhaled steroids: Secondary | ICD-10-CM | POA: Insufficient documentation

## 2015-12-13 DIAGNOSIS — R251 Tremor, unspecified: Secondary | ICD-10-CM | POA: Insufficient documentation

## 2015-12-13 DIAGNOSIS — Z8719 Personal history of other diseases of the digestive system: Secondary | ICD-10-CM | POA: Insufficient documentation

## 2015-12-13 DIAGNOSIS — Z79899 Other long term (current) drug therapy: Secondary | ICD-10-CM | POA: Diagnosis not present

## 2015-12-13 DIAGNOSIS — Z8709 Personal history of other diseases of the respiratory system: Secondary | ICD-10-CM | POA: Diagnosis not present

## 2015-12-13 DIAGNOSIS — Z8739 Personal history of other diseases of the musculoskeletal system and connective tissue: Secondary | ICD-10-CM | POA: Diagnosis not present

## 2015-12-13 DIAGNOSIS — R441 Visual hallucinations: Secondary | ICD-10-CM | POA: Diagnosis not present

## 2015-12-13 DIAGNOSIS — Z8669 Personal history of other diseases of the nervous system and sense organs: Secondary | ICD-10-CM | POA: Insufficient documentation

## 2015-12-13 DIAGNOSIS — R410 Disorientation, unspecified: Secondary | ICD-10-CM

## 2015-12-13 DIAGNOSIS — Z8619 Personal history of other infectious and parasitic diseases: Secondary | ICD-10-CM | POA: Diagnosis not present

## 2015-12-13 DIAGNOSIS — Z8659 Personal history of other mental and behavioral disorders: Secondary | ICD-10-CM | POA: Diagnosis not present

## 2015-12-13 DIAGNOSIS — F0390 Unspecified dementia without behavioral disturbance: Secondary | ICD-10-CM | POA: Diagnosis not present

## 2015-12-13 LAB — ETHANOL: Alcohol, Ethyl (B): <5 mg/dL (ref <5)

## 2015-12-13 LAB — URINALYSIS, ROUTINE W REFLEX MICROSCOPIC
Bilirubin Urine: NEGATIVE
GLUCOSE, UA: NEGATIVE mg/dL
Ketones, ur: NEGATIVE mg/dL
Leukocytes, UA: NEGATIVE
Nitrite: NEGATIVE
PH: 6.5 (ref 5.0–8.0)
Protein, ur: NEGATIVE mg/dL
Specific Gravity, Urine: <1.005 — ABNORMAL LOW (ref 1.005–1.030)

## 2015-12-13 LAB — CBC WITH DIFFERENTIAL/PLATELET
BASOS ABS: 0 10*3/uL (ref 0.0–0.1)
BASOS PCT: 1 %
Eosinophils Absolute: 0.1 10*3/uL (ref 0.0–0.7)
Eosinophils Relative: 2 %
HEMATOCRIT: 38.8 % (ref 36.0–46.0)
HEMOGLOBIN: 13 g/dL (ref 12.0–15.0)
LYMPHS PCT: 17 %
Lymphs Abs: 1 10*3/uL (ref 0.7–4.0)
MCH: 31.9 pg (ref 26.0–34.0)
MCHC: 33.5 g/dL (ref 30.0–36.0)
MCV: 95.3 fL (ref 78.0–100.0)
Monocytes Absolute: 0.4 10*3/uL (ref 0.1–1.0)
Monocytes Relative: 7 %
NEUTROS ABS: 4.3 10*3/uL (ref 1.7–7.7)
NEUTROS PCT: 73 %
Platelets: 213 10*3/uL (ref 150–400)
RBC: 4.07 MIL/uL (ref 3.87–5.11)
RDW: 12.5 % (ref 11.5–15.5)
WBC: 5.9 10*3/uL (ref 4.0–10.5)

## 2015-12-13 LAB — COMPREHENSIVE METABOLIC PANEL
ALBUMIN: 4.1 g/dL (ref 3.5–5.0)
ALK PHOS: 74 U/L (ref 38–126)
ALT: 14 U/L (ref 14–54)
ANION GAP: 6 (ref 5–15)
AST: 23 U/L (ref 15–41)
BILIRUBIN TOTAL: 0.4 mg/dL (ref 0.3–1.2)
BUN: 16 mg/dL (ref 6–20)
CALCIUM: 9.5 mg/dL (ref 8.9–10.3)
CO2: 32 mmol/L (ref 22–32)
Chloride: 102 mmol/L (ref 101–111)
Creatinine, Ser: 1.1 mg/dL — ABNORMAL HIGH (ref 0.44–1.00)
GFR calc non Af Amer: 47 mL/min — ABNORMAL LOW (ref >60)
GFR, EST AFRICAN AMERICAN: 54 mL/min — AB (ref >60)
GLUCOSE: 95 mg/dL (ref 65–99)
POTASSIUM: 3.9 mmol/L (ref 3.5–5.1)
SODIUM: 140 mmol/L (ref 135–145)
TOTAL PROTEIN: 6.9 g/dL (ref 6.5–8.1)

## 2015-12-13 LAB — URINE MICROSCOPIC-ADD ON: WBC UA: NONE SEEN WBC/hpf (ref 0–5)

## 2015-12-13 LAB — TSH: TSH: 1.048 u[IU]/mL (ref 0.350–4.500)

## 2015-12-13 MED ORDER — LORAZEPAM 1 MG PO TABS: 1.0000 mg | ORAL_TABLET | Freq: Three times a day (TID) | ORAL | Status: DC | PRN

## 2015-12-13 MED ORDER — CYCLOSPORINE 0.05 % OP EMUL
1.0000 [drp] | Freq: Two times a day (BID) | OPHTHALMIC | Status: DC
  Administered 2015-12-13 – 2015-12-14 (×3): 1 [drp] via OPHTHALMIC
  Filled 2015-12-13 (×5): qty 1

## 2015-12-13 MED ORDER — PRAVASTATIN SODIUM 10 MG PO TABS
20.0000 mg | ORAL_TABLET | Freq: Every day | ORAL | Status: DC
  Administered 2015-12-13 – 2015-12-14 (×2): 20 mg via ORAL
  Filled 2015-12-13 (×2): qty 2
  Filled 2015-12-13: qty 1

## 2015-12-13 MED ORDER — ONDANSETRON HCL 4 MG PO TABS: 4.0000 mg | ORAL_TABLET | Freq: Three times a day (TID) | ORAL | Status: DC | PRN

## 2015-12-13 MED ORDER — AZELASTINE HCL 0.1 % NA SOLN
2.0000 | Freq: Two times a day (BID) | NASAL | Status: DC
  Administered 2015-12-13 – 2015-12-14 (×3): 2 via NASAL
  Filled 2015-12-13: qty 30

## 2015-12-13 MED ORDER — ALUM & MAG HYDROXIDE-SIMETH 200-200-20 MG/5ML PO SUSP: 30.0000 mL | ORAL | Status: DC | PRN

## 2015-12-13 MED ORDER — DONEPEZIL HCL 5 MG PO TABS
5.0000 mg | ORAL_TABLET | Freq: Every day | ORAL | Status: DC
  Administered 2015-12-13: 5 mg via ORAL
  Filled 2015-12-13 (×2): qty 1

## 2015-12-13 MED ORDER — NICOTINE 21 MG/24HR TD PT24
21.0000 mg | MEDICATED_PATCH | Freq: Every day | TRANSDERMAL | Status: DC
  Filled 2015-12-13: qty 1

## 2015-12-13 MED ORDER — UMECLIDINIUM BROMIDE 62.5 MCG/INH IN AEPB: 1.0000 | INHALATION_SPRAY | Freq: Every day | RESPIRATORY_TRACT | Status: DC

## 2015-12-13 MED ORDER — HYDROCHLOROTHIAZIDE 12.5 MG PO CAPS
12.5000 mg | ORAL_CAPSULE | Freq: Every day | ORAL | Status: DC
  Administered 2015-12-13 – 2015-12-14 (×2): 12.5 mg via ORAL
  Filled 2015-12-13 (×3): qty 1

## 2015-12-13 MED ORDER — IBUPROFEN 400 MG PO TABS: 400.0000 mg | ORAL_TABLET | Freq: Three times a day (TID) | ORAL | Status: DC | PRN

## 2015-12-13 MED ORDER — ACETAMINOPHEN 325 MG PO TABS: 650.0000 mg | ORAL_TABLET | ORAL | Status: DC | PRN

## 2015-12-13 MED ORDER — ALBUTEROL SULFATE HFA 108 (90 BASE) MCG/ACT IN AERS: 1.0000 | INHALATION_SPRAY | Freq: Four times a day (QID) | RESPIRATORY_TRACT | Status: DC | PRN

## 2015-12-13 MED ORDER — MOMETASONE FURO-FORMOTEROL FUM 200-5 MCG/ACT IN AERO
2.0000 | INHALATION_SPRAY | Freq: Two times a day (BID) | RESPIRATORY_TRACT | Status: DC
  Administered 2015-12-13 – 2015-12-14 (×3): 2 via RESPIRATORY_TRACT
  Filled 2015-12-13: qty 8.8

## 2015-12-13 NOTE — ED Notes (Signed)
Pt reports that she "freaked out" but is better now. States she was seeing lights and it was [place] playing pranks on [place].

## 2015-12-13 NOTE — ED Notes (Addendum)
In and out preformed with no urine output. Pt states she feels like she needs to go to the bathroom but nothing will come out.

## 2015-12-13 NOTE — ED Notes (Signed)
MD at bedside. 

## 2015-12-13 NOTE — Telephone Encounter (Signed)
Pt needs placement, agree with ER visit to r/o acute source of infection, has been on Aricept for at least 6 weeks now

## 2015-12-13 NOTE — BH Assessment (Addendum)
Tele Assessment Note   Christine Garza is an 81 y.o. female who presented voluntarily to [facility] due to confusion. Pt was oriented x2 with a very pleasant mood and affect. Upon speaking with her, it was evident that pt was slightly confused. Pt began telling counselor that she got nervous b/c police from [place] were coming into her neighborhood in [place] and they had no business being there. She stated that there was a lot of flashing lights and it scared her. Upon inquiry, pt stated that this occurred about 2 weeks ago. Counselor proceeded to ask pt what bought her to the ED last night. Pt indicated that she "just freaked out" and started shaking really bad at all that was going on. When asked to elaborate, pt started to talk about all of the [facility] police lights. Counselor asked pt if this happened last night or 2 weeks ago, as she previously stated and pt admitted that she didn't know when it was because everything was coming together, at this point.  Counselor asked pt if she felt better, now that she was at the hospital and pt responded that she would feel better at home if she knew it was safe. Counselor asked pt what was making her home unsafe. Pt reported that men were outside her home shining lights on her ceiling. She indicated that they didn't speak to her, but she heard one tell the other, "let's go". Pt also intimated to counselor, as a sidebar, that she was the one that peed on the floor some time ago, as she believed that she was in the hospital for several days.   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 Allergy   . Emphysema of lung (HCC)   . Osteoporosis   . GERD (gastroesophageal reflux disease)   . Pulmonary nodules/lesions, multiple     2 nodules on left side- most recent in 2012 [facility]  . HSV-1 (herpes simplex virus 1) infection     Valtrex prn Cold sores  . Depression   .  Cataract     Dr. Gardenia Phlegm [place]  . Dry eye syndrome   . HPV (human papilloma virus) infection     Dr. Despina Hidden s/p lasar ablation  . DDD (degenerative disc disease), lumbar     Past Surgical History  Procedure Laterality Date  . Eye surgery    . Hpv laser removal    . Laser ablation of the cervix      Dr.Eure    Family History:  Family History  Problem Relation Age of Onset  . Cancer Mother     liver    Social History:  reports that she quit smoking about 19 years ago. She has never used smokeless tobacco. She reports that she does not drink alcohol or use illicit drugs.  Additional Social History:     CIWA: CIWA-Ar BP: 138/62 mmHg Pulse Rate: 101 COWS:    PATIENT STRENGTHS: (choose at least two) Active sense of humor Average or above average intelligence Supportive family/friends  Allergies:  Allergies  Allergen Reactions  . Baclofen Nausea And Vomiting  . Hydrocodone   . Hydromorphone Hcl Nausea And Vomiting    "gives me the nervous shakes"  . Oxycodone   . Cefpodoxime Proxetil Itching and Rash    Home Medications:  (Not in a hospital admission)  OB/GYN Status:  No LMP recorded. Patient is postmenopausal.  General Assessment  Data Location of Assessment: [facility] TTS Assessment: In system Is this a Tele or Face-to-Face Assessment?: Tele Assessment Is this an Initial Assessment or a Re-assessment for this encounter?: Initial Assessment Marital status: Single Is patient pregnant?: No Pregnancy Status: No Living Arrangements: Alone Can pt return to current living arrangement?: Yes Admission Status: Voluntary Is patient capable of signing voluntary admission?: Yes Referral Source: Self/Family/Friend Insurance type: Medicare  Medical Screening Exam [facility] Walk-in ONLY) Medical Exam completed: Yes  Crisis Care Plan Living Arrangements: Alone Name of Psychiatrist: none Name of Therapist: none  Education Status Is patient currently in school?:  No  Risk to self with the past 6 months Suicidal Ideation: No Has patient been a risk to self within the past 6 months prior to admission? : No Suicidal Intent: No Has patient had any suicidal intent within the past 6 months prior to admission? : No Is patient at risk for suicide?: No Suicidal Plan?: No Has patient had any suicidal plan within the past 6 months prior to admission? : No Access to Means: No What has been your use of drugs/alcohol within the last 12 months?: none Previous Attempts/Gestures: No Triggers for Past Attempts: None known Intentional Self Injurious Behavior: None Family Suicide History: Unknown Recent stressful life event(s): Other (Comment) (confusion) Persecutory voices/beliefs?: No Depression: No Substance abuse history and/or treatment for substance abuse?: No Suicide prevention information given to non-admitted patients: Not applicable  Risk to Others within the past 6 months Homicidal Ideation: No Does patient have any lifetime risk of violence toward others beyond the six months prior to admission? : No Thoughts of Harm to Others: No Current Homicidal Intent: No Current Homicidal Plan: No Access to Homicidal Means: No History of harm to others?: No Assessment of Violence: None Noted Does patient have access to weapons?: No Criminal Charges Pending?: No Does patient have a court date: No Is patient on probation?: No  Psychosis Hallucinations: Auditory, Visual Delusions: None noted  Mental Status Report Appearance/Hygiene: Unremarkable Eye Contact: Good Motor Activity: Unremarkable Speech: Logical/coherent Level of Consciousness: Alert Mood: Pleasant Affect: Appropriate to circumstance Anxiety Level: None Thought Processes: Tangential, Coherent Judgement: Partial Orientation: Person, Place Obsessive Compulsive Thoughts/Behaviors: None  Cognitive Functioning Concentration: Normal Memory: Unable to Assess IQ: Average Insight:  Fair Impulse Control: Unable to Assess Appetite: Good Sleep: No Change     Prior Inpatient Therapy Prior Inpatient Therapy: No  Prior Outpatient Therapy Prior Outpatient Therapy: No Does patient have an ACCT team?: No Does patient have Intensive In-House Services?  : No Does patient have [facility] services? : No Does patient have P4CC services?: No  ADL Screening (condition at time of admission) Is the patient deaf or have difficulty hearing?: No Does the patient have difficulty seeing, even when wearing glasses/contacts?: No Does the patient have difficulty concentrating, remembering, or making decisions?: No Does the patient have difficulty dressing or bathing?: No Does the patient have difficulty walking or climbing stairs?: No Weakness of Legs: None Weakness of Arms/Hands: None  Home Assistive Devices/Equipment Home Assistive Devices/Equipment: None  Therapy Consults (therapy consults require a physician order) PT Evaluation Needed: No OT Evalulation Needed: No SLP Evaluation Needed: No Abuse/Neglect Assessment (Assessment to be complete while patient is alone) Physical Abuse: Denies Verbal Abuse: Denies Sexual Abuse: Denies Exploitation of patient/patient's resources: Denies Self-Neglect: Denies Values / Beliefs Cultural Requests During Hospitalization: None Spiritual Requests During Hospitalization: None Consults Spiritual Care Consult Needed: No Social Work Consult Needed: No Merchant navy officer (For Healthcare) Does patient  have an advance directive?: Yes Type of Advance Directive: Healthcare Power of Attorney    Additional Information 1:1 In Past 12 Months?: No CIRT Risk: No Elopement Risk: No Does patient have medical clearance?: Yes     Disposition:  Disposition Initial Assessment Completed for this Encounter: Yes Disposition of Patient: Other dispositions (per May Agustin, NP) Other disposition(s): Other (Comment) (keep overnight and re-evaluate  in the AM)  Laddie AquasSamantha M Elorah Dewing 12/13/2015 10:15 AM

## 2015-12-13 NOTE — ED Notes (Signed)
Started having the shakes this morning for unknown reasons.

## 2015-12-13 NOTE — ED Provider Notes (Signed)
CSN: [id number]     Arrival date & time 12/13/15  0439 History   First MD Initiated Contact with Patient 12/13/15 [phone]     Chief Complaint  Patient presents with  . Tremors   Level V caveat for possible dementia  (Consider location/radiation/quality/duration/timing/severity/associated sxs/prior Treatment) HPI   Patient presented to the emergency room after her paper man found her this morning and thought she was shaky. Patient states she got nervous and shaky. She states she was sitting in the living room with the lights on when her paper man delivered her paper. She states that is unusual because normally she is asleep with the lights out.  He called her grandson who told him to stay with her until the ambulance arrived. She states she feels fine now. She does not know how long it lasted. She does admit to having memory problems "for a long time" however she's been trying to hide it from her family. After talking about what could've made her nervous she then admitted that her neighbor had asked her to watch her house because she was getting married and was going to be gone. Patient states she saw different colored lights walking around her house during the night and she heard voices talking about "what are we going to do now". She states she called the police and they came and stated there was no one there. She states she knows there were people there. She states she's never had that happen before.  PCP Dr Eliott Nineunham  Past Medical History  Diagnosis Date  . Allergy   . Emphysema of lung (HCC)   . Osteoporosis   . GERD (gastroesophageal reflux disease)   . Pulmonary nodules/lesions, multiple     2 nodules on left side- most recent in 2012 LLL  . HSV-1 (herpes simplex virus 1) infection     Valtrex prn Cold sores  . Depression   . Cataract     Dr. Gardenia PhlegmHecker [place]  . Dry eye syndrome   . HPV (human papilloma virus) infection     Dr. Despina HiddenEure s/p lasar ablation  . DDD (degenerative disc  disease), lumbar    Past Surgical History  Procedure Laterality Date  . Eye surgery    . Hpv laser removal    . Laser ablation of the cervix      Dr.Eure   Family History  Problem Relation Age of Onset  . Cancer Mother     liver   Social History  Substance Use Topics  . Smoking status: Former Smoker    Quit date: 05/09/1996  . Smokeless tobacco: Never Used  . Alcohol Use: No   lives alone   OB History    Gravida Para Term Preterm AB TAB SAB Ectopic Multiple Living   1 1 1       1      Review of Systems  All other systems reviewed and are negative.     Allergies  Baclofen; Hydrocodone; Hydromorphone hcl; Oxycodone; and Cefpodoxime proxetil  Home Medications   Prior to Admission medications   Medication Sig Start Date End Date Taking? Authorizing Provider  albuterol (PROVENTIL HFA;VENTOLIN HFA) 108 (90 BASE) MCG/ACT inhaler Inhale 1-2 puffs into the lungs every 6 (six) hours as needed for wheezing or shortness of breath. 02/04/15   Zadie Rhineonald Wickline, MD  albuterol (PROVENTIL) (2.5 MG/3ML) 0.083% nebulizer solution INHALE CONTENTS OF 1 VIAL VIA NEBULIZER EVERY 6 HOURS AS NEEDED FOR SHORTNESS OF BREATH. (DX: COPD J44.9, LENGTH OF NEED:  LIFETIME) 04/25/15   Salley Scarlet, MD  azelastine (ASTELIN) 0.1 % nasal spray Place 2 sprays into both nostrils 2 (two) times daily. Use in each nostril as directed 03/22/15   Salley Scarlet, MD  cycloSPORINE (RESTASIS) 0.05 % ophthalmic emulsion Place 1 drop into both eyes 2 (two) times daily. 12/14/14   Salley Scarlet, MD  donepezil (ARICEPT) 5 MG tablet Take 1 tablet (5 mg total) by mouth at bedtime. 11/01/15   Salley Scarlet, MD  fluticasone-salmeterol (ADVAIR HFA) [phone] MCG/ACT inhaler Inhale 1 puff into the lungs 2 (two) times daily. 12/14/14   Salley Scarlet, MD  hydrochlorothiazide (MICROZIDE) 12.5 MG capsule TAKE 1 CAPSULE BY MOUTH DAILY AS NEEDED FOR EDEMA. 05/22/15   Salley Scarlet, MD  pravastatin (PRAVACHOL) 20 MG tablet  Take 1 tablet (20 mg total) by mouth daily. 12/15/14   Salley Scarlet, MD  Umeclidinium Bromide (INCRUSE ELLIPTA) 62.5 MCG/INH AEPB Inhale 1 puff into the lungs daily. 01/03/15   Salley Scarlet, MD   BP 122/77 mmHg  Pulse 99  Temp(Src) 97.6 F (36.4 C) (Rectal)  Resp 20  Ht 5' (1.524 m)  Wt 110 lb (49.896 kg)  BMI 21.48 kg/m2  SpO2 100%  Vital signs normal   Physical Exam  Constitutional: She appears well-developed and well-nourished.  Non-toxic appearance. She does not appear ill. No distress.  HENT:  Head: Normocephalic and atraumatic.  Right Ear: External ear normal.  Left Ear: External ear normal.  Nose: Nose normal. No mucosal edema or rhinorrhea.  Mouth/Throat: Oropharynx is clear and moist and mucous membranes are normal. No dental abscesses or uvula swelling.  Eyes: Conjunctivae and EOM are normal. Pupils are equal, round, and reactive to light.  Neck: Normal range of motion and full passive range of motion without pain. Neck supple.  Cardiovascular: Normal rate, regular rhythm and normal heart sounds.  Exam reveals no gallop and no friction rub.   No murmur heard. Pulmonary/Chest: Effort normal and breath sounds normal. No respiratory distress. She has no wheezes. She has no rhonchi. She has no rales. She exhibits no tenderness and no crepitus.  Abdominal: Soft. Normal appearance and bowel sounds are normal. She exhibits no distension. There is no tenderness. There is no rebound and no guarding.  Musculoskeletal: Normal range of motion. She exhibits no edema or tenderness.  Moves all extremities well.   Neurological: She is alert. She has normal strength. No cranial nerve deficit.  Patient does not know the year, states "I have not known what year it was since I was 81 years old"  Patient's face is symmetrical. Her grips are equal, she has no pronator drift, she has no weakness in her extremities.  Skin: Skin is warm, dry and intact. No rash noted. No erythema. No pallor.   Psychiatric: She has a normal mood and affect. Her speech is normal and behavior is normal. Her mood appears not anxious.  Nursing note and vitals reviewed.   ED Course  Procedures (including critical care time)  Patient does admit to having some visual and auditory hallucinations last night. She appears to have some underlying dementia with long history of memory problems. She will need a psychiatric evaluation.    Labs Review Results for orders placed or performed during the hospital encounter of 12/13/15  Comprehensive metabolic panel  Result Value Ref Range   Sodium 140 135 - 145 mmol/L   Potassium 3.9 3.5 - 5.1 mmol/L   Chloride 102 101 -  111 mmol/L   CO2 32 22 - 32 mmol/L   Glucose, Bld 95 65 - 99 mg/dL   BUN 16 6 - 20 mg/dL   Creatinine, Ser 1.61 (H) 0.44 - 1.00 mg/dL   Calcium 9.5 8.9 - 09.6 mg/dL   Total Protein 6.9 6.5 - 8.1 g/dL   Albumin 4.1 3.5 - 5.0 g/dL   AST 23 15 - 41 U/L   ALT 14 14 - 54 U/L   Alkaline Phosphatase 74 38 - 126 U/L   Total Bilirubin 0.4 0.3 - 1.2 mg/dL   GFR calc non Af Amer 47 (L) >60 mL/min   GFR calc Af Amer 54 (L) >60 mL/min   Anion gap 6 5 - 15  CBC with Differential  Result Value Ref Range   WBC 5.9 4.0 - 10.5 K/uL   RBC 4.07 3.87 - 5.11 MIL/uL   Hemoglobin 13.0 12.0 - 15.0 g/dL   HCT 04.5 40.9 - 81.1 %   MCV 95.3 78.0 - 100.0 fL   MCH 31.9 26.0 - 34.0 pg   MCHC 33.5 30.0 - 36.0 g/dL   RDW 91.4 78.2 - 95.6 %   Platelets 213 150 - 400 K/uL   Neutrophils Relative % 73 %   Neutro Abs 4.3 1.7 - 7.7 K/uL   Lymphocytes Relative 17 %   Lymphs Abs 1.0 0.7 - 4.0 K/uL   Monocytes Relative 7 %   Monocytes Absolute 0.4 0.1 - 1.0 K/uL   Eosinophils Relative 2 %   Eosinophils Absolute 0.1 0.0 - 0.7 K/uL   Basophils Relative 1 %   Basophils Absolute 0.0 0.0 - 0.1 K/uL  Ethanol  Result Value Ref Range   Alcohol, Ethyl (B) <5 <5 mg/dL   Laboratory interpretation all normal     Imaging Review No results found. I have personally  reviewed and evaluated these images and lab results as part of my medical decision-making.   EKG Interpretation   Date/Time:  Wednesday December 13 2015 04:49:16 EST Ventricular Rate:  95 PR Interval:  164 QRS Duration: 118 QT Interval:  360 QTC Calculation: 452 R Axis:   99 Text Interpretation:  Sinus rhythm RAE, consider biatrial enlargement  Incomplete right bundle branch block Low voltage, precordial leads No  significant change since last tracing 04 Feb 2015 Confirmed by Torrian Canion   MD-I, Gordon Vandunk ([id number]) on 12/13/2015 5:35:48 AM      MDM   Final diagnoses:  Auditory hallucination  Visual hallucination    Disposition pending   Devoria Albe, MD, Concha Pyo, MD 12/13/15 [phone]

## 2015-12-13 NOTE — Telephone Encounter (Signed)
Lane patientMaurice Marchs son or grandson calling to speak with you regarding ms Bhola's medical condition  Please call him at [phone]

## 2015-12-13 NOTE — Telephone Encounter (Signed)
Received call from grandson [name] stating that pt has been on Aricept and since been on she has been crying out all the time and states that 'Meds are making her crazy" wants to know if you can prescribe her something different.  FYI: pt has been admitted to hospital.  [facility]   Call back [phone]

## 2015-12-13 NOTE — Telephone Encounter (Signed)
To MD.  Of note, spoke with patient grandson Maurice MarchLane. Advised to D/C Aricept and make F/U appointment with MD, but we will defer to ER at this time.   Recommended to F/U with MD ASAP after discharge.

## 2015-12-14 DIAGNOSIS — R410 Disorientation, unspecified: Secondary | ICD-10-CM

## 2015-12-14 DIAGNOSIS — R441 Visual hallucinations: Secondary | ICD-10-CM | POA: Diagnosis not present

## 2015-12-14 MED ORDER — IPRATROPIUM-ALBUTEROL 0.5-2.5 (3) MG/3ML IN SOLN
RESPIRATORY_TRACT | Status: AC
  Filled 2015-12-14: qty 3

## 2015-12-14 NOTE — Discharge Instructions (Signed)
Call Dr. Deirdre Peerurham's office today or tomorrow to schedule next available appointment. Office staff can discuss additional needs that you may have at home or placement in assisted living . Return if your condition worsens for any reason

## 2015-12-14 NOTE — Consult Note (Signed)
Telepsych Consultation   Reason for Consult:  Patient stated she saw lights Referring Physician:  [facility] Provider Patient Identification: Christine Garza MRN:  [id number] Principal Diagnosis: Confusion Diagnosis:   Patient Active Problem List   Diagnosis Date Noted  . Confusion [R41.0] 12/14/2015    Priority: High  . Infected skin tear [T14.8, L08.9] 01/27/2015  . Protein-calorie malnutrition ([place]) [E46] 12/13/2014  . Pain of left side of body [R52] 03/18/2013  . Anxiety state [F41.1] 03/18/2013  . Tremor [R25.1] 03/18/2013  . Cervical spine fracture ([place]) [S12.9XXA] 06/20/2012  . Urinary frequency [R35.0] 03/04/2012  . OA (osteoarthritis) [M19.90] 10/08/2011  . PULMONARY NODULE [J98.4] 07/14/2009  . COPD (chronic obstructive pulmonary disease) ([place]) [J44.9] 11/08/2008  . Hyperlipidemia [E78.5] 11/08/2006  . GERD [K21.9] 11/08/2006  . OSTEOPOROSIS [M81.0] 11/08/2006    Total Time spent with patient: 30 minutes  Subjective:   Christine Garza is a 81 y.o. female patient admitted with .  HPI:  Per TTS counselor:  Christine Garza is a 81 y.o. female who presented voluntarily to [facility] due to confusion. Pt was oriented x2 with a very pleasant mood and affect. Upon speaking with her, it was evident that pt was slightly confused. Pt began telling counselor that she got nervous b/c police from [place] were coming into her neighborhood in [place] and they had no business being there. She stated that there was a lot of flashing lights and it scared her. Upon inquiry, pt stated that this occurred about 2 weeks ago. Counselor proceeded to ask pt what bought her to the ED last night. Pt indicated that she "just freaked out" and started shaking really bad at all that was going on. When asked to elaborate, pt started to talk about all of the [facility] police lights. Counselor asked pt if this happened last night or 2 weeks ago, as she previously stated and pt admitted that she didn't know when it  was because everything was coming together, at this point.  Counselor asked pt if she felt better, now that she was at the hospital and pt responded that she would feel better at home if she knew it was safe. Counselor asked pt what was making her home unsafe. Pt reported that men were outside her home shining lights on her ceiling. She indicated that they didn't speak to her, but she heard one tell the other, "let's go". Pt also intimated to counselor, as a sidebar, that she was the one that peed on the floor some time ago, as she believed that she was in the hospital for several days.   She was seen today and patient was alert and oriented.  She denies SI and HI.  She reports that she saw lights outside her window and that it was probably a police car just patrolling her neighborhood.  She vehemently denies ever taking her life.  She states she is happy t home and that she has an aid that helps her do her ADL'S and bakes pecan pies with her.  Discussed that she had some bacteria in her urine that may have contributed to some of her reported confusion.    HPI Elements:   See HPI  Past Medical History:  Past Medical History  Diagnosis Date  . Allergy   . Emphysema of lung ([place])   . Osteoporosis   . GERD (gastroesophageal reflux disease)   . Pulmonary nodules/lesions, multiple     2 nodules on left side- most recent in 2012  LLL  . HSV-1 (herpes simplex virus 1) infection     Valtrex prn Cold sores  . Depression   . Cataract     Dr. Brien Mates [place]  . Dry eye syndrome   . HPV (human papilloma virus) infection     Dr. Elonda Husky s/p lasar ablation  . DDD (degenerative disc disease), lumbar     Past Surgical History  Procedure Laterality Date  . Eye surgery    . Hpv laser removal    . Laser ablation of the cervix      Dr.Eure   Family History:  Family History  Problem Relation Age of Onset  . Cancer Mother     liver   Social History:  History  Alcohol Use No     History  Drug  Use No    Social History   Social History  . Marital Status: Divorced    Spouse Name: N/A  . Number of Children: N/A  . Years of Education: N/A   Social History Main Topics  . Smoking status: Former Smoker    Quit date: 05/09/1996  . Smokeless tobacco: Never Used  . Alcohol Use: No  . Drug Use: No  . Sexual Activity: Not Currently   Other Topics Concern  . None   Social History Narrative   Additional Social History:   Allergies:   Allergies  Allergen Reactions  . Baclofen Nausea And Vomiting  . Hydrocodone   . Hydromorphone Hcl Nausea And Vomiting    "gives me the nervous shakes"  . Oxycodone   . Cefpodoxime Proxetil Itching and Rash    Labs:  Results for orders placed or performed during the hospital encounter of 12/13/15 (from the past 48 hour(s))  Comprehensive metabolic panel     Status: Abnormal   Collection Time: 12/13/15  7:04 AM  Result Value Ref Range   Sodium 140 135 - 145 mmol/L   Potassium 3.9 3.5 - 5.1 mmol/L   Chloride 102 101 - 111 mmol/L   CO2 32 22 - 32 mmol/L   Glucose, Bld 95 65 - 99 mg/dL   BUN 16 6 - 20 mg/dL   Creatinine, Ser 1.10 (H) 0.44 - 1.00 mg/dL   Calcium 9.5 8.9 - 10.3 mg/dL   Total Protein 6.9 6.5 - 8.1 g/dL   Albumin 4.1 3.5 - 5.0 g/dL   AST 23 15 - 41 U/L   ALT 14 14 - 54 U/L   Alkaline Phosphatase 74 38 - 126 U/L   Total Bilirubin 0.4 0.3 - 1.2 mg/dL   GFR calc non Af Amer 47 (L) >60 mL/min   GFR calc Af Amer 54 (L) >60 mL/min    Comment: (NOTE) The eGFR has been calculated using the CKD EPI equation. This calculation has not been validated in all clinical situations. eGFR's persistently <60 mL/min signify possible Chronic Kidney Disease.    Anion gap 6 5 - 15  CBC with Differential     Status: None   Collection Time: 12/13/15  7:04 AM  Result Value Ref Range   WBC 5.9 4.0 - 10.5 K/uL   RBC 4.07 3.87 - 5.11 MIL/uL   Hemoglobin 13.0 12.0 - 15.0 g/dL   HCT 38.8 36.0 - 46.0 %   MCV 95.3 78.0 - 100.0 fL   MCH 31.9  26.0 - 34.0 pg   MCHC 33.5 30.0 - 36.0 g/dL   RDW 12.5 11.5 - 15.5 %   Platelets 213 150 - 400 K/uL  Neutrophils Relative % 73 %   Neutro Abs 4.3 1.7 - 7.7 K/uL   Lymphocytes Relative 17 %   Lymphs Abs 1.0 0.7 - 4.0 K/uL   Monocytes Relative 7 %   Monocytes Absolute 0.4 0.1 - 1.0 K/uL   Eosinophils Relative 2 %   Eosinophils Absolute 0.1 0.0 - 0.7 K/uL   Basophils Relative 1 %   Basophils Absolute 0.0 0.0 - 0.1 K/uL  Ethanol     Status: None   Collection Time: 12/13/15  7:04 AM  Result Value Ref Range   Alcohol, Ethyl (B) <5 <5 mg/dL    Comment:        LOWEST DETECTABLE LIMIT FOR SERUM ALCOHOL IS 5 mg/dL FOR MEDICAL PURPOSES ONLY   TSH     Status: None   Collection Time: 12/13/15  7:08 AM  Result Value Ref Range   TSH 1.048 0.350 - 4.500 uIU/mL  Urinalysis, Routine w reflex microscopic     Status: Abnormal   Collection Time: 12/13/15  8:11 AM  Result Value Ref Range   Color, Urine YELLOW YELLOW   APPearance CLEAR CLEAR   Specific Gravity, Urine <1.005 (L) 1.005 - 1.030   pH 6.5 5.0 - 8.0   Glucose, UA NEGATIVE NEGATIVE mg/dL   Hgb urine dipstick TRACE (A) NEGATIVE   Bilirubin Urine NEGATIVE NEGATIVE   Ketones, ur NEGATIVE NEGATIVE mg/dL   Protein, ur NEGATIVE NEGATIVE mg/dL   Nitrite NEGATIVE NEGATIVE   Leukocytes, UA NEGATIVE NEGATIVE  Urine microscopic-add on     Status: Abnormal   Collection Time: 12/13/15  8:11 AM  Result Value Ref Range   Squamous Epithelial / LPF 0-5 (A) NONE SEEN   WBC, UA NONE SEEN 0 - 5 WBC/hpf   RBC / HPF 0-5 0 - 5 RBC/hpf   Bacteria, UA MANY (A) NONE SEEN    Vitals: Blood pressure 133/60, pulse 89, temperature 98.3 F (36.8 C), temperature source Oral, resp. rate 18, height 5' (1.524 m), weight 49.896 kg (110 lb), SpO2 96 %.  Risk to Self: Suicidal Ideation: No Suicidal Intent: No Is patient at risk for suicide?: No Suicidal Plan?: No Access to Means: No What has been your use of drugs/alcohol within the last 12 months?:  none Triggers for Past Attempts: None known Intentional Self Injurious Behavior: None Risk to Others: Homicidal Ideation: No Thoughts of Harm to Others: No Current Homicidal Intent: No Current Homicidal Plan: No Access to Homicidal Means: No History of harm to others?: No Assessment of Violence: None Noted Does patient have access to weapons?: No Criminal Charges Pending?: No Does patient have a court date: No Prior Inpatient Therapy: Prior Inpatient Therapy: No Prior Outpatient Therapy: Prior Outpatient Therapy: No Does patient have an ACCT team?: No Does patient have Intensive In-House Services?  : No Does patient have [facility] services? : No Does patient have P4[facility] services?: No  No current facility-administered medications for this encounter.   Current Outpatient Prescriptions  Medication Sig Dispense Refill  . azelastine (ASTELIN) 0.1 % nasal spray Place 2 sprays into both nostrils 2 (two) times daily. Use in each nostril as directed 30 mL 12  . donepezil (ARICEPT) 5 MG tablet Take 1 tablet (5 mg total) by mouth at bedtime. 30 tablet 6  . pravastatin (PRAVACHOL) 20 MG tablet Take 1 tablet (20 mg total) by mouth daily. 90 tablet 3  . Umeclidinium Bromide (INCRUSE ELLIPTA) 62.5 MCG/INH AEPB Inhale 1 puff into the lungs daily. 1 each 11  .  albuterol (PROVENTIL HFA;VENTOLIN HFA) 108 (90 BASE) MCG/ACT inhaler Inhale 1-2 puffs into the lungs every 6 (six) hours as needed for wheezing or shortness of breath. 1 Inhaler 0  . albuterol (PROVENTIL) (2.5 MG/3ML) 0.083% nebulizer solution INHALE CONTENTS OF 1 VIAL VIA NEBULIZER EVERY 6 HOURS AS NEEDED FOR SHORTNESS OF BREATH. (DX: COPD J44.9, LENGTH OF NEED: LIFETIME) 75 mL 3  . cycloSPORINE (RESTASIS) 0.05 % ophthalmic emulsion Place 1 drop into both eyes 2 (two) times daily. 1 each 3  . fluticasone-salmeterol (ADVAIR HFA) 230-21 MCG/ACT inhaler Inhale 1 puff into the lungs 2 (two) times daily. 1 Inhaler 6  . hydrochlorothiazide (MICROZIDE)  12.5 MG capsule TAKE 1 CAPSULE BY MOUTH DAILY AS NEEDED FOR EDEMA. 30 capsule 0    Musculoskeletal: Strength & Muscle Tone: within normal limits Gait & Station: normal Patient leans: N/A  Psychiatric Specialty Exam: Physical Exam  Vitals reviewed.   Review of Systems  All other systems reviewed and are negative.   Blood pressure 133/60, pulse 89, temperature 98.3 F (36.8 C), temperature source Oral, resp. rate 18, height 5' (1.524 m), weight 49.896 kg (110 lb), SpO2 96 %.Body mass index is 21.48 kg/(m^2).  General Appearance: Neat  Eye Contact::  Fair  Speech:  Normal Rate  Volume:  Normal  Mood:  Euthymic  Affect:  Appropriate  Thought Process:  Logical  Orientation:  Full (Time, Place, and Person)  Thought Content:  Rumination  Suicidal Thoughts:  No  Homicidal Thoughts:  No  Memory:  Immediate;   Good Recent;   Good Remote;   Good  Judgement:  Good  Insight:  Good  Psychomotor Activity:  Normal  Concentration:  Good  Recall:  Good  Fund of Knowledge:Good  Language: Good  Akathisia:  Negative  Handed:  Right  AIMS (if indicated):     Assets:  Communication Skills Desire for Improvement Resilience Social Support  ADL's:  Intact  Cognition: WNL  Sleep:      Medical Decision Making: Established Problem, Stable/Improving (1), Review of Psycho-Social Stressors (1) and Discuss test with performing physician (1)   Treatment Plan Summary: Plan DC to home  Plan:  No evidence of imminent risk to self or others at present.   Patient does not meet criteria for psychiatric inpatient admission. Supportive therapy provided about ongoing stressors. Discussed crisis plan, support from social network, calling 911, coming to the Emergency Department, and calling Suicide Hotline. Disposition: Recommend discharge once medically stable.  Does not meet criteria for psych treatment.  Dr Dwyane Dee concurs with plan.  Freda Munro May Ahliya Glatt AGBP-BC 12/14/2015 6:41 PM

## 2015-12-14 NOTE — ED Provider Notes (Addendum)
11:40 AM patient asymptomatic feels well and feels ready to go home. Evaluable by psychiatry. She can follow-up with her primary care physician. Patient has home health aide who visits her for 4 hours each day and an adult grandson who came to get her to the hospital and sees her daily. He is also reports that he is in constant telephone contact with patient. Patient has supportive family. I spoke with Frazier Richards, PA for Dr. Jeanice Lim. Plan is to schedule close follow-up appointment with Dr. Jeanice Lim to work on increased home health needs or placement Results for orders placed or performed during the hospital encounter of 12/13/15  Urinalysis, Routine w reflex microscopic  Result Value Ref Range   Color, Urine YELLOW YELLOW   APPearance CLEAR CLEAR   Specific Gravity, Urine <1.005 (L) 1.005 - 1.030   pH 6.5 5.0 - 8.0   Glucose, UA NEGATIVE NEGATIVE mg/dL   Hgb urine dipstick TRACE (A) NEGATIVE   Bilirubin Urine NEGATIVE NEGATIVE   Ketones, ur NEGATIVE NEGATIVE mg/dL   Protein, ur NEGATIVE NEGATIVE mg/dL   Nitrite NEGATIVE NEGATIVE   Leukocytes, UA NEGATIVE NEGATIVE  Comprehensive metabolic panel  Result Value Ref Range   Sodium 140 135 - 145 mmol/L   Potassium 3.9 3.5 - 5.1 mmol/L   Chloride 102 101 - 111 mmol/L   CO2 32 22 - 32 mmol/L   Glucose, Bld 95 65 - 99 mg/dL   BUN 16 6 - 20 mg/dL   Creatinine, Ser 1.61 (H) 0.44 - 1.00 mg/dL   Calcium 9.5 8.9 - 09.6 mg/dL   Total Protein 6.9 6.5 - 8.1 g/dL   Albumin 4.1 3.5 - 5.0 g/dL   AST 23 15 - 41 U/L   ALT 14 14 - 54 U/L   Alkaline Phosphatase 74 38 - 126 U/L   Total Bilirubin 0.4 0.3 - 1.2 mg/dL   GFR calc non Af Amer 47 (L) >60 mL/min   GFR calc Af Amer 54 (L) >60 mL/min   Anion gap 6 5 - 15  CBC with Differential  Result Value Ref Range   WBC 5.9 4.0 - 10.5 K/uL   RBC 4.07 3.87 - 5.11 MIL/uL   Hemoglobin 13.0 12.0 - 15.0 g/dL   HCT 04.5 40.9 - 81.1 %   MCV 95.3 78.0 - 100.0 fL   MCH 31.9 26.0 - 34.0 pg   MCHC 33.5 30.0 - 36.0  g/dL   RDW 91.4 78.2 - 95.6 %   Platelets 213 150 - 400 K/uL   Neutrophils Relative % 73 %   Neutro Abs 4.3 1.7 - 7.7 K/uL   Lymphocytes Relative 17 %   Lymphs Abs 1.0 0.7 - 4.0 K/uL   Monocytes Relative 7 %   Monocytes Absolute 0.4 0.1 - 1.0 K/uL   Eosinophils Relative 2 %   Eosinophils Absolute 0.1 0.0 - 0.7 K/uL   Basophils Relative 1 %   Basophils Absolute 0.0 0.0 - 0.1 K/uL  Ethanol  Result Value Ref Range   Alcohol, Ethyl (B) <5 <5 mg/dL  TSH  Result Value Ref Range   TSH 1.048 0.350 - 4.500 uIU/mL  Urine microscopic-add on  Result Value Ref Range   Squamous Epithelial / LPF 0-5 (A) NONE SEEN   WBC, UA NONE SEEN 0 - 5 WBC/hpf   RBC / HPF 0-5 0 - 5 RBC/hpf   Bacteria, UA MANY (A) NONE SEEN   No results found.  Csae discussed with psychiatry Doug Sou, MD 12/14/15  1210  Doug SouSam Cristian Davitt, MD 12/14/15 1213

## 2015-12-15 ENCOUNTER — Telehealth: Payer: Self-pay | Admitting: *Deleted

## 2015-12-15 NOTE — Telephone Encounter (Signed)
Call placed to patient grandson, Christine Garza to schedule ER F/U.   [facility].

## 2015-12-19 NOTE — Telephone Encounter (Signed)
Appointment scheduled for Friday. 

## 2015-12-22 ENCOUNTER — Encounter: Payer: Self-pay | Admitting: Family Medicine

## 2015-12-22 ENCOUNTER — Ambulatory Visit (INDEPENDENT_AMBULATORY_CARE_PROVIDER_SITE_OTHER): Payer: Medicare Other | Admitting: Family Medicine

## 2015-12-22 VITALS — BP 100/60 | HR 88 | Temp 97.1°F | Wt 109.0 lb

## 2015-12-22 DIAGNOSIS — E46 Unspecified protein-calorie malnutrition: Secondary | ICD-10-CM

## 2015-12-22 DIAGNOSIS — F0390 Unspecified dementia without behavioral disturbance: Secondary | ICD-10-CM | POA: Diagnosis not present

## 2015-12-22 DIAGNOSIS — J411 Mucopurulent chronic bronchitis: Secondary | ICD-10-CM

## 2015-12-22 NOTE — Assessment & Plan Note (Addendum)
Continue with current inhalers. She needs help with medication administration she is wearing her oxygen as prescribed  is not had her inhalers today as her grandson could not get to the home early enough due to working third shift

## 2015-12-22 NOTE — Progress Notes (Signed)
Patient ID: Christine Garza Sackmann, female   DOB: November 06, 1935, 81 y.o.   MRN: [id number]   Subjective:    Patient ID: Christine Garza Teems, female    DOB: November 06, 1935, [age] y.o.   MRN: [id number]  Patient presents for Follow-up  patient here for follow-up. She was seen in the emergency room for what sounds like dementia related episode. She started that she solved the cops from a nearby town around her. She became very agitated with tremor she seemed to be hallucinating. She was evaluated in the emergency room she was seen by tell psychiatry who thought this may be due to urinary tract infection assessment of her underlying dementia. She was not admitted there were no medication changes. She was actually treated for any urinary tract infection. She continues to have difficulties with her memory she needs care 24 hours and there is no family to provide this. She does have a home health aid that is therefore a few hours during the day. She still called her grandson multiple times especially in the nighttime because she is afraid her thinks that she is seeing things. He is typically able to calm her down. She's been very reluctant to go into  Any type of living facility. Today grandson is in agreement in regard to move forward with putting her in an assisted living may need FL2 form she cannot care for herself    Review Of Systems:  GEN- denies fatigue, fever, weight loss,weakness, recent illness HEENT- denies eye drainage, change in vision, nasal discharge, CVS- denies chest pain, palpitations RESP- denies SOB, cough, wheeze ABD- denies N/V, change in stools, abd pain GU- denies dysuria, hematuria, dribbling, incontinence MSK- denies joint pain, muscle aches, injury Neuro- denies headache, dizziness, syncope, seizure activity       Objective:    BP 100/60 mmHg  Pulse 88  Temp(Src) 97.1 F (36.2 C) (Oral)  Wt 109 lb (49.442 kg)  SpO2 98% GEN- NAD, alert and oriented x3 HEENT- PERRL, EOMI, non  injected sclera, pink conjunctiva, MMM, oropharynx clear CVS- RRR, no murmur RESP-CTAB, 2L oxygen ABD-NABS,soft,NT,ND Psych- teaful and upset stating she doesn't want to leave her home. No apparent hallucinations, normal speech EXT- No edema Pulses- Radial - 2+        Assessment & Plan:      Problem List Items Addressed This Visit    Protein-calorie malnutrition (HCC)   Dementia - Primary     She needs 24-hour care. We were able to talk her into trying the assisted living facility for at least a month as well for get her established into a facility and comfortable. If not have to go the route of psychiatry to get her to clear incompetent which will take some time. I provided the F L2 form to her grandson today.      COPD (chronic obstructive pulmonary disease) (HCC)     Continue with current inhalers. She needs help with medication administration she is wearing her oxygen as prescribed  is not had her inhalers today as her grandson could not get to the home early enough due to working third shift         Note: This dictation was prepared with Nurse, children'sDragon dictation along with smaller Lobbyistphrase technology. Any transcriptional errors that result from this process are unintentional.

## 2015-12-22 NOTE — Assessment & Plan Note (Signed)
She needs 24-hour care. We were able to talk her into trying the assisted living facility for at least a month as well for get her established into a facility and comfortable. If not have to go the route of psychiatry to get her to clear incompetent which will take some time. I provided the F L2 form to her grandson today.

## 2015-12-22 NOTE — Patient Instructions (Signed)
FL2 form given  F/U pending placement

## 2015-12-24 ENCOUNTER — Emergency Department (HOSPITAL_COMMUNITY): Payer: Medicare Other

## 2015-12-24 ENCOUNTER — Encounter (HOSPITAL_COMMUNITY): Payer: Self-pay

## 2015-12-24 ENCOUNTER — Inpatient Hospital Stay (HOSPITAL_COMMUNITY)
Admission: EM | Admit: 2015-12-24 | Discharge: 2016-01-10 | DRG: 190 | Disposition: E | Payer: Medicare Other | Attending: Internal Medicine | Admitting: Internal Medicine

## 2015-12-24 DIAGNOSIS — R918 Other nonspecific abnormal finding of lung field: Secondary | ICD-10-CM | POA: Diagnosis present

## 2015-12-24 DIAGNOSIS — M810 Age-related osteoporosis without current pathological fracture: Secondary | ICD-10-CM | POA: Diagnosis present

## 2015-12-24 DIAGNOSIS — Z9981 Dependence on supplemental oxygen: Secondary | ICD-10-CM

## 2015-12-24 DIAGNOSIS — J441 Chronic obstructive pulmonary disease with (acute) exacerbation: Principal | ICD-10-CM | POA: Diagnosis present

## 2015-12-24 DIAGNOSIS — F0390 Unspecified dementia without behavioral disturbance: Secondary | ICD-10-CM | POA: Diagnosis not present

## 2015-12-24 DIAGNOSIS — Z87891 Personal history of nicotine dependence: Secondary | ICD-10-CM

## 2015-12-24 DIAGNOSIS — J9620 Acute and chronic respiratory failure, unspecified whether with hypoxia or hypercapnia: Secondary | ICD-10-CM | POA: Diagnosis present

## 2015-12-24 DIAGNOSIS — J9621 Acute and chronic respiratory failure with hypoxia: Secondary | ICD-10-CM | POA: Diagnosis not present

## 2015-12-24 DIAGNOSIS — Z23 Encounter for immunization: Secondary | ICD-10-CM | POA: Diagnosis not present

## 2015-12-24 DIAGNOSIS — K219 Gastro-esophageal reflux disease without esophagitis: Secondary | ICD-10-CM | POA: Diagnosis present

## 2015-12-24 DIAGNOSIS — I10 Essential (primary) hypertension: Secondary | ICD-10-CM | POA: Diagnosis present

## 2015-12-24 DIAGNOSIS — Z66 Do not resuscitate: Secondary | ICD-10-CM | POA: Diagnosis present

## 2015-12-24 DIAGNOSIS — R0602 Shortness of breath: Secondary | ICD-10-CM | POA: Diagnosis present

## 2015-12-24 DIAGNOSIS — Z515 Encounter for palliative care: Secondary | ICD-10-CM | POA: Diagnosis present

## 2015-12-24 HISTORY — DX: Dependence on supplemental oxygen: Z99.81

## 2015-12-24 HISTORY — DX: Unspecified chronic bronchitis: J42

## 2015-12-24 HISTORY — DX: Unspecified dementia, unspecified severity, without behavioral disturbance, psychotic disturbance, mood disturbance, and anxiety: F03.90

## 2015-12-24 LAB — CBC WITH DIFFERENTIAL/PLATELET
BASOS PCT: 1 %
Basophils Absolute: 0 10*3/uL (ref 0.0–0.1)
Eosinophils Absolute: 0 10*3/uL (ref 0.0–0.7)
Eosinophils Relative: 0 %
HEMATOCRIT: 41.1 % (ref 36.0–46.0)
HEMOGLOBIN: 13.8 g/dL (ref 12.0–15.0)
LYMPHS ABS: 0.7 10*3/uL (ref 0.7–4.0)
Lymphocytes Relative: 9 %
MCH: 31.7 pg (ref 26.0–34.0)
MCHC: 33.6 g/dL (ref 30.0–36.0)
MCV: 94.5 fL (ref 78.0–100.0)
MONO ABS: 0.8 10*3/uL (ref 0.1–1.0)
MONOS PCT: 11 %
NEUTROS ABS: 5.8 10*3/uL (ref 1.7–7.7)
NEUTROS PCT: 79 %
Platelets: 226 10*3/uL (ref 150–400)
RBC: 4.35 MIL/uL (ref 3.87–5.11)
RDW: 12.3 % (ref 11.5–15.5)
WBC: 7.3 10*3/uL (ref 4.0–10.5)

## 2015-12-24 LAB — BASIC METABOLIC PANEL
Anion gap: 11 (ref 5–15)
BUN: 26 mg/dL — AB (ref 6–20)
CALCIUM: 9.4 mg/dL (ref 8.9–10.3)
CHLORIDE: 99 mmol/L — AB (ref 101–111)
CO2: 25 mmol/L (ref 22–32)
CREATININE: 0.98 mg/dL (ref 0.44–1.00)
GFR calc non Af Amer: 54 mL/min — ABNORMAL LOW (ref >60)
GLUCOSE: 133 mg/dL — AB (ref 65–99)
Potassium: 4.2 mmol/L (ref 3.5–5.1)
Sodium: 135 mmol/L (ref 135–145)

## 2015-12-24 LAB — TROPONIN I: Troponin I: <0.03 ng/mL (ref <0.031)

## 2015-12-24 MED ORDER — METHYLPREDNISOLONE SODIUM SUCC 125 MG IJ SOLR
125.0000 mg | Freq: Once | INTRAMUSCULAR | Status: AC
  Administered 2015-12-24: 125 mg via INTRAVENOUS
  Filled 2015-12-24: qty 2

## 2015-12-24 MED ORDER — IPRATROPIUM BROMIDE 0.02 % IN SOLN
1.0000 mg | Freq: Once | RESPIRATORY_TRACT | Status: AC
  Administered 2015-12-24: 1 mg via RESPIRATORY_TRACT
  Filled 2015-12-24: qty 5

## 2015-12-24 MED ORDER — ALBUTEROL SULFATE (2.5 MG/3ML) 0.083% IN NEBU: 2.5000 mg | INHALATION_SOLUTION | Freq: Four times a day (QID) | RESPIRATORY_TRACT | Status: DC

## 2015-12-24 MED ORDER — SODIUM CHLORIDE 0.9 % IJ SOLN: 3.0000 mL | INTRAMUSCULAR | Status: DC | PRN

## 2015-12-24 MED ORDER — ALBUTEROL SULFATE (2.5 MG/3ML) 0.083% IN NEBU
2.5000 mg | INHALATION_SOLUTION | RESPIRATORY_TRACT | Status: DC | PRN
  Administered 2015-12-25 (×2): 2.5 mg via RESPIRATORY_TRACT
  Filled 2015-12-24 (×2): qty 3

## 2015-12-24 MED ORDER — ALBUTEROL SULFATE (2.5 MG/3ML) 0.083% IN NEBU
2.5000 mg | INHALATION_SOLUTION | RESPIRATORY_TRACT | Status: DC
  Administered 2015-12-24: 2.5 mg via RESPIRATORY_TRACT
  Filled 2015-12-24: qty 3

## 2015-12-24 MED ORDER — ONDANSETRON HCL 4 MG/2ML IJ SOLN: 4.0000 mg | Freq: Four times a day (QID) | INTRAMUSCULAR | Status: DC | PRN

## 2015-12-24 MED ORDER — SODIUM CHLORIDE 0.9 % IV SOLN
INTRAVENOUS | Status: DC
  Administered 2015-12-24: 75 mL/h via INTRAVENOUS

## 2015-12-24 MED ORDER — PRAVASTATIN SODIUM 10 MG PO TABS: 20.0000 mg | ORAL_TABLET | Freq: Every day | ORAL | Status: DC

## 2015-12-24 MED ORDER — SODIUM CHLORIDE 0.9 % IV SOLN: INTRAVENOUS | Status: DC

## 2015-12-24 MED ORDER — AMLODIPINE BESYLATE 5 MG PO TABS: 10.0000 mg | ORAL_TABLET | Freq: Every day | ORAL | Status: DC

## 2015-12-24 MED ORDER — GUAIFENESIN ER 600 MG PO TB12
600.0000 mg | ORAL_TABLET | Freq: Two times a day (BID) | ORAL | Status: DC
Start: 2015-12-24 — End: 2015-12-25
  Administered 2015-12-24: 600 mg via ORAL
  Filled 2015-12-24: qty 1

## 2015-12-24 MED ORDER — ENOXAPARIN SODIUM 40 MG/0.4ML ~~LOC~~ SOLN
40.0000 mg | SUBCUTANEOUS | Status: DC
  Filled 2015-12-24: qty 0.4

## 2015-12-24 MED ORDER — ONDANSETRON HCL 4 MG PO TABS: 4.0000 mg | ORAL_TABLET | Freq: Four times a day (QID) | ORAL | Status: DC | PRN

## 2015-12-24 MED ORDER — HYDRALAZINE HCL 25 MG PO TABS: 25.0000 mg | ORAL_TABLET | Freq: Four times a day (QID) | ORAL | Status: DC | PRN

## 2015-12-24 MED ORDER — HYDROCHLOROTHIAZIDE 12.5 MG PO CAPS: 12.5000 mg | ORAL_CAPSULE | Freq: Every day | ORAL | Status: DC

## 2015-12-24 MED ORDER — IPRATROPIUM BROMIDE 0.02 % IN SOLN
0.5000 mg | Freq: Four times a day (QID) | RESPIRATORY_TRACT | Status: DC
  Administered 2015-12-24: 0.5 mg via RESPIRATORY_TRACT
  Filled 2015-12-24: qty 2.5

## 2015-12-24 MED ORDER — SODIUM CHLORIDE 0.9 % IV SOLN: 250.0000 mL | INTRAVENOUS | Status: DC | PRN

## 2015-12-24 MED ORDER — INFLUENZA VAC SPLIT QUAD 0.5 ML IM SUSY: 0.5000 mL | PREFILLED_SYRINGE | INTRAMUSCULAR | Status: DC

## 2015-12-24 MED ORDER — SODIUM CHLORIDE 0.9 % IJ SOLN
3.0000 mL | Freq: Two times a day (BID) | INTRAMUSCULAR | Status: DC
  Administered 2015-12-24: 3 mL via INTRAVENOUS

## 2015-12-24 MED ORDER — ALBUTEROL (5 MG/ML) CONTINUOUS INHALATION SOLN
10.0000 mg/h | INHALATION_SOLUTION | Freq: Once | RESPIRATORY_TRACT | Status: AC
  Administered 2015-12-24: 10 mg/h via RESPIRATORY_TRACT
  Filled 2015-12-24: qty 20

## 2015-12-24 MED ORDER — DONEPEZIL HCL 5 MG PO TABS
5.0000 mg | ORAL_TABLET | Freq: Every day | ORAL | Status: DC
  Administered 2015-12-24: 5 mg via ORAL
  Filled 2015-12-24: qty 1

## 2015-12-24 MED ORDER — METHYLPREDNISOLONE SODIUM SUCC 125 MG IJ SOLR
60.0000 mg | Freq: Four times a day (QID) | INTRAMUSCULAR | Status: DC
  Administered 2015-12-24 – 2015-12-25 (×3): 60 mg via INTRAVENOUS
  Filled 2015-12-24 (×3): qty 2

## 2015-12-24 MED ORDER — IPRATROPIUM-ALBUTEROL 0.5-2.5 (3) MG/3ML IN SOLN
3.0000 mL | Freq: Four times a day (QID) | RESPIRATORY_TRACT | Status: DC
  Administered 2015-12-25 (×2): 3 mL via RESPIRATORY_TRACT
  Filled 2015-12-24 (×2): qty 3

## 2015-12-24 NOTE — ED Notes (Signed)
Pt's grandson attempting to get patient to complete breathing treatment at this time.  Pt is cooperating.  Breathing remains labored.

## 2015-12-24 NOTE — ED Provider Notes (Signed)
CSN: [id number]     Arrival date & time 12/27/2015  1611 History   First MD Initiated Contact with Patient 12/17/2015 1615     Chief Complaint  Patient presents with  . Shortness of Breath      Patient is a 81 y.o. female presenting with shortness of breath. The history is provided by the patient and the EMS personnel. The history is limited by the condition of the patient (Hx dementia).  Shortness of Breath Pt was seen at 1620.  Per EMS and pt, c/o gradual onset and worsening of persistent cough, wheezing and SOB for the past 2 days.  Describes her symptoms as "my COPD."  Has been using home O2 N/C, MDI and nebs without relief.  Denies CP/palpitations, no back pain, no abd pain, no N/V/D, no fevers, no rash. EMS gave neb en route without improvement.    Past Medical History  Diagnosis Date  . Allergy   . Emphysema of lung (HCC)   . Osteoporosis   . GERD (gastroesophageal reflux disease)   . Pulmonary nodules/lesions, multiple     2 nodules on left side- most recent in 2012 L[facility]  . HSV-1 (herpes simplex virus 1) infection     Valtrex prn Cold sores  . Depression   . Cataract     Dr. Gardenia Phlegm [place]  . Dry eye syndrome   . HPV (human papilloma virus) infection     Dr. Despina Hidden s/p lasar ablation  . DDD (degenerative disc disease), lumbar   . Dementia   . Chronic bronchitis (HCC)   . On home O2     2L N/C continuous   Past Surgical History  Procedure Laterality Date  . Eye surgery    . Hpv laser removal    . Laser ablation of the cervix      Dr.Eure   Family History  Problem Relation Age of Onset  . Cancer Mother     liver   Social History  Substance Use Topics  . Smoking status: Former Smoker    Quit date: 05/09/1996  . Smokeless tobacco: Never Used  . Alcohol Use: No   OB History    Gravida Para Term Preterm AB TAB SAB Ectopic Multiple Living   1 1 1       1      Review of Systems  Unable to perform ROS: Dementia  Respiratory: Positive for shortness of breath.        Allergies  Baclofen; Hydrocodone; Hydromorphone hcl; Oxycodone; and Cefpodoxime proxetil  Home Medications   Prior to Admission medications   Medication Sig Start Date End Date Taking? Authorizing Provider  albuterol (PROVENTIL HFA;VENTOLIN HFA) 108 (90 BASE) MCG/ACT inhaler Inhale 1-2 puffs into the lungs every 6 (six) hours as needed for wheezing or shortness of breath. 02/04/15   Zadie Rhine, MD  albuterol (PROVENTIL) (2.5 MG/3ML) 0.083% nebulizer solution INHALE CONTENTS OF 1 VIAL VIA NEBULIZER EVERY 6 HOURS AS NEEDED FOR SHORTNESS OF BREATH. (DX: COPD J44.9, LENGTH OF NEED: LIFETIME) 04/25/15   Salley Scarlet, MD  azelastine (ASTELIN) 0.1 % nasal spray Place 2 sprays into both nostrils 2 (two) times daily. Use in each nostril as directed 03/22/15   Salley Scarlet, MD  cycloSPORINE (RESTASIS) 0.05 % ophthalmic emulsion Place 1 drop into both eyes 2 (two) times daily. 12/14/14   Salley Scarlet, MD  donepezil (ARICEPT) 5 MG tablet Take 1 tablet (5 mg total) by mouth at bedtime. 11/01/15   Velna Hatchet  [place], MD  fluticasone-salmeterol (ADVAIR HFA) 230-21 MCG/ACT inhaler Inhale 1 puff into the lungs 2 (two) times daily. 12/14/14   Salley Scarlet, MD  hydrochlorothiazide (MICROZIDE) 12.5 MG capsule TAKE 1 CAPSULE BY MOUTH DAILY AS NEEDED FOR EDEMA. 05/22/15   Salley Scarlet, MD  pravastatin (PRAVACHOL) 20 MG tablet Take 1 tablet (20 mg total) by mouth daily. 12/15/14   Salley Scarlet, MD  Umeclidinium Bromide (INCRUSE ELLIPTA) 62.5 MCG/INH AEPB Inhale 1 puff into the lungs daily. 01/03/15   Salley Scarlet, MD   BP 154/85 mmHg  Pulse 116  Temp(Src) 98.2 F (36.8 C) (Oral)  Resp 29  Ht 5\' 2"  (1.575 m)  SpO2 98% Physical Exam  1625: Physical examination:  Nursing notes reviewed; Vital signs and O2 SAT reviewed;  Constitutional: Well developed, Well nourished, Uncomfortable appearing.; Head:  Normocephalic, atraumatic; Eyes: EOMI, PERRL, No scleral icterus; ENMT: Mouth and  pharynx normal, Mucous membranes dry; Neck: Supple, Full range of motion, No lymphadenopathy; Cardiovascular: Tachycardic rate and rhythm, No gallop; Respiratory: Breath sounds diminished & equal bilaterally, scattered faint wheezes. No audible wheezing. Speaking short phrases, sitting upright, tachypneic.;; Chest: Nontender, Movement normal; Abdomen: Soft, Nontender, Nondistended, Normal bowel sounds; Genitourinary: No CVA tenderness; Extremities: Pulses normal, No tenderness, No edema, No calf edema or asymmetry.; Neuro: Awake, alert, mildly confused per hx. Major CN grossly intact.  Speech clear. No gross focal motor deficits in extremities.; Skin: Color normal, Warm, Dry.; Psych:  Intermittently agitated.    ED Course  Procedures (including critical care time) Labs Review  Imaging Review  I have personally reviewed and evaluated these images and lab results as part of my medical decision-making.   EKG Interpretation   Date/Time:  Sunday December 24 2015 16:16:45 EST Ventricular Rate:  122 PR Interval:  147 QRS Duration: 128 QT Interval:  327 QTC Calculation: 466 R Axis:   133 Text Interpretation:  Sinus tachycardia Ventricular premature complex  Consider right atrial enlargement Nonspecific intraventricular conduction  delay Baseline wander Artifact When compared with ECG of 12/13/2015 No  significant change was found Confirmed by [facility]  MD, Nicholos Johns [phone]) on  12/26/2015 4:31:15 PM      MDM  MDM Reviewed: previous chart, nursing note and vitals Reviewed previous: labs and ECG Interpretation: labs, ECG, x-ray and CT scan Total time providing critical care: 30-74 minutes. This excludes time spent performing separately reportable procedures and services. Consults: admitting MD   CRITICAL CARE Performed by: Laray Anger Total critical care time: 35 minutes Critical care time was exclusive of separately billable procedures and treating other patients. Critical care was  necessary to treat or prevent imminent or life-threatening deterioration. Critical care was time spent personally by me on the following activities: development of treatment plan with patient and/or surrogate as well as nursing, discussions with consultants, evaluation of patient's response to treatment, examination of patient, obtaining history from patient or surrogate, ordering and performing treatments and interventions, ordering and review of laboratory studies, ordering and review of radiographic studies, pulse oximetry and re-evaluation of patient's condition.    Results for orders placed or performed during the hospital encounter of 12/11/2015  Troponin I  Result Value Ref Range   Troponin I <0.03 <0.031 ng/mL  CBC with Differential/Platelet  Result Value Ref Range   WBC 7.3 4.0 - 10.5 K/uL   RBC 4.35 3.87 - 5.11 MIL/uL   Hemoglobin 13.8 12.0 - 15.0 g/dL   HCT 60.4 54.0 - 98.1 %   MCV 94.5 78.0 -  100.0 fL   MCH 31.7 26.0 - 34.0 pg   MCHC 33.6 30.0 - 36.0 g/dL   RDW 16.112.3 09.611.5 - 04.515.5 %   Platelets 226 150 - 400 K/uL   Neutrophils Relative % 79 %   Neutro Abs 5.8 1.7 - 7.7 K/uL   Lymphocytes Relative 9 %   Lymphs Abs 0.7 0.7 - 4.0 K/uL   Monocytes Relative 11 %   Monocytes Absolute 0.8 0.1 - 1.0 K/uL   Eosinophils Relative 0 %   Eosinophils Absolute 0.0 0.0 - 0.7 K/uL   Basophils Relative 1 %   Basophils Absolute 0.0 0.0 - 0.1 K/uL  Basic metabolic panel  Result Value Ref Range   Sodium 135 135 - 145 mmol/L   Potassium 4.2 3.5 - 5.1 mmol/L   Chloride 99 (L) 101 - 111 mmol/L   CO2 25 22 - 32 mmol/L   Glucose, Bld 133 (H) 65 - 99 mg/dL   BUN 26 (H) 6 - 20 mg/dL   Creatinine, Ser 4.090.98 0.44 - 1.00 mg/dL   Calcium 9.4 8.9 - 81.110.3 mg/dL   GFR calc non Af Amer 54 (L) >60 mL/min   GFR calc Af Amer >60 >60 mL/min   Anion gap 11 5 - 15   Ct Chest Wo Contrast 2016-04-26  CLINICAL DATA:  Parenchymal lung lesion on chest radiograph EXAM: CT CHEST WITHOUT CONTRAST TECHNIQUE:  Multidetector CT imaging of the chest was performed following the standard protocol without IV contrast. COMPARISON:  Chest radiographic examination is February 04, 2015 and December 24, 2015 FINDINGS: Mediastinum/Lymph Nodes: Thyroid appears unremarkable. There is no demonstrable thoracic adenopathy. There are scattered foci of coronary artery calcification. There is atherosclerotic calcification in the aorta without demonstrable aneurysm. The visualized great vessels appear unremarkable except for mild atherosclerotic calcification. The pericardium is not thickened. Lungs/Pleura: In the area of concern on chest radiograph, there is an irregular spiculated mass in the posterior left apex measuring 2.9 x 2.2 cm. There is scarring in both apices, more on the right than on the left with the scarring in the right apex appearing stable by chest radiography. There is underlying emphysematous change with scattered areas of scarring bilaterally. There is no appreciable pleural effusion or pleural thickening. No edema or consolidation. Upper abdomen: In the visualized upper abdominal region, there is incomplete visualization of a noncystic mass arising from the right kidney measuring 2.5 x 2.3 cm. There is atherosclerotic calcification in the aorta. Adrenals appear normal bilaterally. Visualized upper abdominal structures otherwise appear unremarkable. Musculoskeletal: There is mild anterior wedging of the T11 vertebral body. There is degenerative change at T12-L1. There are no blastic or lytic bone lesions. IMPRESSION: Spiculated lesion in the left apex concerning for neoplasm. This appearance warrants nuclear medicine PET study to further assess. Underlying emphysema with scattered areas of scarring in the lungs, most notably in the right apex and anterior segment left upper lobe regions. No edema or consolidation. No apparent adenopathy.  No adrenal lesions. Incomplete visualization of noncystic right renal mass. Further  evaluation warranted. In this regard, nonemergent pre and post-contrast MR or CT of the kidneys advised to further assess. Atherosclerotic calcification with areas of coronary artery calcification noted. Electronically Signed   By: Bretta BangWilliam  Woodruff III M.D.   On: 02017-05-19 18:45   Dg Chest Port 1 View 2016-04-26  CLINICAL DATA:  Shortness of breath, cough and wheezing. EXAM: PORTABLE CHEST 1 VIEW COMPARISON:  02/04/2015 chest radiograph. FINDINGS: Stable cardiomediastinal silhouette with normal heart size.  No pneumothorax. No pleural effusion. Hyperinflated lungs. Prominent emphysema in the mid to upper lungs bilaterally. There is a new focal spiculated opacity at the left lung apex. No pulmonary edema. IMPRESSION: 1. New focal spiculated opacity at the left lung apex, nonspecific, cannot exclude a neoplasm. Recommend further evaluation with chest CT with IV contrast. 2. Prominent emphysema in the mid to upper lungs. Hyperinflated lungs. Findings suggest COPD. Electronically Signed   By: Delbert Phenix M.D.   On: 01-07-2016 16:45    1905:  On arrival: pt sitting upright, lungs diminished with faint wheezing. IV solumedrol and hour long neb ordered. Pt has been intermittently agitated while on neb, stating her "face was getting wet," she "wanted a break," etc. Pt strongly encouraged to continue neb by her grandson (caregiver). CXR with new lung mass; CT chest f/u as above.  T/C to [facility] Dr. Sharl Ma, case discussed, including:  HPI, pertinent PM/SHx, VS/PE, dx testing, ED course and treatment:  Agreeable to admit, requests to write temporary orders, obtain medical bed to team APAdmits.   Samuel Jester, DO 12/27/15 1701

## 2015-12-24 NOTE — ED Notes (Signed)
Pt complain of being SOB. States she has COPD. Wheezing on arrival to ED. Pt was given two albuterol breathing treatment by EMS

## 2015-12-24 NOTE — H&P (Signed)
PCP:   Milinda AntisURHAM, KAWANTA, MD   Chief Complaint:  Shortness of breath  HPI: 81 year old female who   has a past medical history of Allergy; Emphysema of lung (HCC); Osteoporosis; GERD (gastroesophageal reflux disease); Pulmonary nodules/lesions, multiple; HSV-1 (herpes simplex virus 1) infection; Depression; Cataract; Dry eye syndrome; HPV (human papilloma virus) infection; DDD (degenerative disc disease), lumbar; Dementia; Chronic bronchitis (HCC); and On home O2. Today presents to the hospital with chief complaint of shortness of breath. Patient has a history of COPD and has been on home oxygen. She was getting MDIs and meds without relief. Patient complains of persistent cough wheezing and coughing up yellow-colored phlegm. Denies nausea vomiting or diarrhea. Complains of chest pain with coughing.  In the ED chest x-ray showed no focal speaker at opacity at left lung apex, cannot exclude neoplasm. CT of the chest was done which showed spiculated lesion in the left apex concerning for neoplasm recommend PET scan.  Allergies:   Allergies  Allergen Reactions  . Baclofen Nausea And Vomiting  . Hydrocodone Itching  . Hydromorphone Hcl Nausea And Vomiting    "gives me the nervous shakes"  . Oxycodone Itching  . Cefpodoxime Proxetil Itching and Rash      Past Medical History  Diagnosis Date  . Allergy   . Emphysema of lung (HCC)   . Osteoporosis   . GERD (gastroesophageal reflux disease)   . Pulmonary nodules/lesions, multiple     2 nodules on left side- most recent in 2012 L[facility]  . HSV-1 (herpes simplex virus 1) infection     Valtrex prn Cold sores  . Depression   . Cataract     Dr. Gardenia PhlegmHecker [place]  . Dry eye syndrome   . HPV (human papilloma virus) infection     Dr. Despina HiddenEure s/p lasar ablation  . DDD (degenerative disc disease), lumbar   . Dementia   . Chronic bronchitis (HCC)   . On home O2     2L N/C continuous    Past Surgical History  Procedure Laterality Date  .  Eye surgery    . Hpv laser removal    . Laser ablation of the cervix      Dr.Eure    Prior to Admission medications   Medication Sig Start Date End Date Taking? Authorizing Provider  albuterol (PROVENTIL HFA;VENTOLIN HFA) 108 (90 BASE) MCG/ACT inhaler Inhale 1-2 puffs into the lungs every 6 (six) hours as needed for wheezing or shortness of breath. 02/04/15   Zadie Rhineonald Wickline, MD  albuterol (PROVENTIL) (2.5 MG/3ML) 0.083% nebulizer solution INHALE CONTENTS OF 1 VIAL VIA NEBULIZER EVERY 6 HOURS AS NEEDED FOR SHORTNESS OF BREATH. (DX: COPD J44.9, LENGTH OF NEED: LIFETIME) 04/25/15   Salley ScarletKawanta F Roberts, MD  azelastine (ASTELIN) 0.1 % nasal spray Place 2 sprays into both nostrils 2 (two) times daily. Use in each nostril as directed 03/22/15   Salley ScarletKawanta F McCoy, MD  cycloSPORINE (RESTASIS) 0.05 % ophthalmic emulsion Place 1 drop into both eyes 2 (two) times daily. 12/14/14   Salley ScarletKawanta F Crozier, MD  donepezil (ARICEPT) 5 MG tablet Take 1 tablet (5 mg total) by mouth at bedtime. 11/01/15   Salley ScarletKawanta F [place], MD  fluticasone-salmeterol (ADVAIR HFA) 214 050 3467230-21 MCG/ACT inhaler Inhale 1 puff into the lungs 2 (two) times daily. 12/14/14   Salley ScarletKawanta F [place], MD  hydrochlorothiazide (MICROZIDE) 12.5 MG capsule TAKE 1 CAPSULE BY MOUTH DAILY AS NEEDED FOR EDEMA. 05/22/15   Salley ScarletKawanta F , MD  pravastatin (PRAVACHOL) 20 MG tablet Take  1 tablet (20 mg total) by mouth daily. 12/15/14   Salley Scarlet, MD  Umeclidinium Bromide (INCRUSE ELLIPTA) 62.5 MCG/INH AEPB Inhale 1 puff into the lungs daily. 01/03/15   Salley Scarlet, MD    Social History:  reports that she quit smoking about 19 years ago. She has never used smokeless tobacco. She reports that she does not drink alcohol or use illicit drugs.  Family History  Problem Relation Age of Onset  . Cancer Mother     liver    There were no vitals filed for this visit.  All the positives are listed in BOLD  Review of Systems:  HEENT: Headache, blurred vision, runny nose,  sore throat Neck: Hypothyroidism, hyperthyroidism,,lymphadenopathy Chest : Shortness of breath, history of COPD, Asthma Heart : Chest pain, history of coronary arterey disease GI:  Nausea, vomiting, diarrhea, constipation, GERD GU: Dysuria, urgency, frequency of urination, hematuria Neuro: Stroke, seizures, syncope Psych: Depression, anxiety, hallucinations   Physical Exam: Blood pressure 122/49, pulse 122, temperature 98.2 F (36.8 C), temperature source Oral, resp. rate 33, height 5\' 2"  (1.575 m), SpO2 96 %. Constitutional:   Patient is a well-developed and well-nourished female* in no acute distress and cooperative with exam. Head: Normocephalic and atraumatic Mouth: Mucus membranes moist Eyes: PERRL, EOMI, conjunctivae normal Neck: Supple, No Thyromegaly Cardiovascular: RRR, S1 normal, S2 normal Pulmonary/Chest: Decreased breath sounds bilaterally Abdominal: Soft. Non-tender, non-distended, bowel sounds are normal, no masses, organomegaly, or guarding present.  Neurological: A&O x3, Strength is normal and symmetric bilaterally, cranial nerve II-XII are grossly intact, no focal motor deficit, sensory intact to light touch bilaterally.  Extremities : No Cyanosis, Clubbing or Edema  Labs on Admission:  Basic Metabolic Panel:  Recent Labs Lab January 12, 2016 1641  NA 135  K 4.2  CL 99*  CO2 25  GLUCOSE 133*  BUN 26*  CREATININE 0.98  CALCIUM 9.4   CBC:  Recent Labs Lab 01/12/2016 1641  WBC 7.3  NEUTROABS 5.8  HGB 13.8  HCT 41.1  MCV 94.5  PLT 226   Cardiac Enzymes:  Recent Labs Lab 12-Jan-2016 1641  TROPONINI <0.03     Radiological Exams on Admission: Ct Chest Wo Contrast  Jan 12, 2016  CLINICAL DATA:  Parenchymal lung lesion on chest radiograph EXAM: CT CHEST WITHOUT CONTRAST TECHNIQUE: Multidetector CT imaging of the chest was performed following the standard protocol without IV contrast. COMPARISON:  Chest radiographic examination is February 04, 2015 and 2016/01/12 FINDINGS: Mediastinum/Lymph Nodes: Thyroid appears unremarkable. There is no demonstrable thoracic adenopathy. There are scattered foci of coronary artery calcification. There is atherosclerotic calcification in the aorta without demonstrable aneurysm. The visualized great vessels appear unremarkable except for mild atherosclerotic calcification. The pericardium is not thickened. Lungs/Pleura: In the area of concern on chest radiograph, there is an irregular spiculated mass in the posterior left apex measuring 2.9 x 2.2 cm. There is scarring in both apices, more on the right than on the left with the scarring in the right apex appearing stable by chest radiography. There is underlying emphysematous change with scattered areas of scarring bilaterally. There is no appreciable pleural effusion or pleural thickening. No edema or consolidation. Upper abdomen: In the visualized upper abdominal region, there is incomplete visualization of a noncystic mass arising from the right kidney measuring 2.5 x 2.3 cm. There is atherosclerotic calcification in the aorta. Adrenals appear normal bilaterally. Visualized upper abdominal structures otherwise appear unremarkable. Musculoskeletal: There is mild anterior wedging of the T11 vertebral body. There  is degenerative change at T12-L1. There are no blastic or lytic bone lesions. IMPRESSION: Spiculated lesion in the left apex concerning for neoplasm. This appearance warrants nuclear medicine PET study to further assess. Underlying emphysema with scattered areas of scarring in the lungs, most notably in the right apex and anterior segment left upper lobe regions. No edema or consolidation. No apparent adenopathy.  No adrenal lesions. Incomplete visualization of noncystic right renal mass. Further evaluation warranted. In this regard, nonemergent pre and post-contrast MR or CT of the kidneys advised to further assess. Atherosclerotic calcification with areas of coronary artery  calcification noted. Electronically Signed   By: Bretta Bang III M.D.   On: 2016/01/14 18:45   Dg Chest Port 1 View  14-Jan-2016  CLINICAL DATA:  Shortness of breath, cough and wheezing. EXAM: PORTABLE CHEST 1 VIEW COMPARISON:  02/04/2015 chest radiograph. FINDINGS: Stable cardiomediastinal silhouette with normal heart size. No pneumothorax. No pleural effusion. Hyperinflated lungs. Prominent emphysema in the mid to upper lungs bilaterally. There is a new focal spiculated opacity at the left lung apex. No pulmonary edema. IMPRESSION: 1. New focal spiculated opacity at the left lung apex, nonspecific, cannot exclude a neoplasm. Recommend further evaluation with chest CT with IV contrast. 2. Prominent emphysema in the mid to upper lungs. Hyperinflated lungs. Findings suggest COPD. Electronically Signed   By: Delbert Phenix M.D.   On: 01/14/2016 16:45    EKG: Independently reviewed. Sinus tachycardia   Assessment/Plan Active Problems:   Dementia   COPD with acute exacerbation (HCC)   COPD exacerbation (HCC)   Lung mass   Hypertension  COPD exacerbation Admit the patient, start Solu-Medrol 60 mg IV every 6 hours, DuoNeb nebulizers every 6 hours. Mucinex 1 tablet by mouth twice a day.  Hypertension Blood pressure is elevated, will restart home medication HCTZ 12.5 mg by mouth daily. Also add amlodipine 10 mg by mouth daily. Start hydralazine 25 mg by mouth every 6 hours when necessary for BP greater than 160/100.  Lung mass CT chest showed spell occluded left apex lung mass, recommended PET scan. Discussed with patient's grandson, patient will need outpatient follow-up with oncology.  DVT prophylaxis Lovenox   Code status: DO NOT RESUSCITATE  Family discussion: Admission, patients condition and plan of care including tests being ordered have been discussed with the patient and her grandson on phone who indicate understanding and agree with the plan and Code Status.   Time Spent on  Admission: 60 min  Sherleen Pangborn S [facility] Pager: [phone] January 14, 2016, 7:42 PM  If 7PM-7AM, please contact night-coverage  [email or web address]  Password [email or web address]

## 2015-12-24 NOTE — ED Notes (Signed)
Pt pulled her iv site and slight bruising noted around site with blood leaking.  Continues to flush and flow without difficulty.  Pt requests that it not be removed.

## 2015-12-24 NOTE — ED Notes (Signed)
Pt removed her breathing treatment and states that she cannot continue.  States she just doesn't like it and doesn't want it anymore.

## 2015-12-24 NOTE — ED Notes (Signed)
Pt's grandson, Maurice MarchLane, would like to be called with any updates as he has to return to work.  [phone]

## 2015-12-25 DIAGNOSIS — I10 Essential (primary) hypertension: Secondary | ICD-10-CM

## 2015-12-25 DIAGNOSIS — F0390 Unspecified dementia without behavioral disturbance: Secondary | ICD-10-CM

## 2015-12-25 DIAGNOSIS — J9622 Acute and chronic respiratory failure with hypercapnia: Secondary | ICD-10-CM

## 2015-12-25 DIAGNOSIS — J9621 Acute and chronic respiratory failure with hypoxia: Secondary | ICD-10-CM

## 2015-12-25 DIAGNOSIS — J441 Chronic obstructive pulmonary disease with (acute) exacerbation: Principal | ICD-10-CM

## 2015-12-25 LAB — BRAIN NATRIURETIC PEPTIDE: B Natriuretic Peptide: 287 pg/mL — ABNORMAL HIGH (ref 0.0–100.0)

## 2015-12-25 LAB — COMPREHENSIVE METABOLIC PANEL
ALBUMIN: 4.4 g/dL (ref 3.5–5.0)
ALT: 18 U/L (ref 14–54)
ANION GAP: 13 (ref 5–15)
AST: 39 U/L (ref 15–41)
Alkaline Phosphatase: 87 U/L (ref 38–126)
BUN: 32 mg/dL — ABNORMAL HIGH (ref 6–20)
CHLORIDE: 100 mmol/L — AB (ref 101–111)
CO2: 27 mmol/L (ref 22–32)
Calcium: 9.2 mg/dL (ref 8.9–10.3)
Creatinine, Ser: 1.07 mg/dL — ABNORMAL HIGH (ref 0.44–1.00)
GFR calc Af Amer: 56 mL/min — ABNORMAL LOW (ref >60)
GFR calc non Af Amer: 48 mL/min — ABNORMAL LOW (ref >60)
GLUCOSE: 255 mg/dL — AB (ref 65–99)
POTASSIUM: 4.2 mmol/L (ref 3.5–5.1)
SODIUM: 140 mmol/L (ref 135–145)
TOTAL PROTEIN: 8 g/dL (ref 6.5–8.1)
Total Bilirubin: 0.4 mg/dL (ref 0.3–1.2)

## 2015-12-25 LAB — BLOOD GAS, ARTERIAL
DELIVERY SYSTEMS: POSITIVE
DRAWN BY: 25788
Expiratory PAP: 5
FIO2: 40
Inspiratory PAP: 13
Mode: POSITIVE
O2 SAT: 88.3 %
PH ART: 7.046 — AB (ref 7.350–7.450)
pO2, Arterial: 72.5 mmHg — ABNORMAL LOW (ref 80.0–100.0)

## 2015-12-25 LAB — CBC
HCT: 39.9 % (ref 36.0–46.0)
Hemoglobin: 13.1 g/dL (ref 12.0–15.0)
MCH: 31 pg (ref 26.0–34.0)
MCHC: 32.8 g/dL (ref 30.0–36.0)
MCV: 94.5 fL (ref 78.0–100.0)
PLATELETS: 336 10*3/uL (ref 150–400)
RBC: 4.22 MIL/uL (ref 3.87–5.11)
RDW: 12.5 % (ref 11.5–15.5)
WBC: 11.7 10*3/uL — ABNORMAL HIGH (ref 4.0–10.5)

## 2015-12-25 LAB — GLUCOSE, CAPILLARY: GLUCOSE-CAPILLARY: 228 mg/dL — AB (ref 65–99)

## 2015-12-25 MED ORDER — HALOPERIDOL LACTATE 5 MG/ML IJ SOLN: 0.5000 mg | INTRAMUSCULAR | Status: DC | PRN

## 2015-12-25 MED ORDER — MORPHINE SULFATE (PF) 2 MG/ML IV SOLN: 0.5000 mg | Freq: Once | INTRAVENOUS | Status: DC

## 2015-12-25 MED ORDER — HALOPERIDOL 0.5 MG PO TABS
0.5000 mg | ORAL_TABLET | ORAL | Status: DC | PRN
  Filled 2015-12-25: qty 1

## 2015-12-25 MED ORDER — MORPHINE SULFATE (PF) 2 MG/ML IV SOLN
0.5000 mg | Freq: Once | INTRAVENOUS | Status: AC
  Administered 2015-12-25: 0.5 mg via INTRAVENOUS
  Filled 2015-12-25: qty 1

## 2015-12-25 MED ORDER — POLYVINYL ALCOHOL 1.4 % OP SOLN: 1.0000 [drp] | Freq: Four times a day (QID) | OPHTHALMIC | Status: DC | PRN

## 2015-12-25 MED ORDER — ALBUTEROL SULFATE (2.5 MG/3ML) 0.083% IN NEBU
INHALATION_SOLUTION | RESPIRATORY_TRACT | Status: AC
  Administered 2015-12-25: 2.5 mg
  Filled 2015-12-25: qty 3

## 2015-12-25 MED ORDER — GLYCOPYRROLATE 1 MG PO TABS
1.0000 mg | ORAL_TABLET | ORAL | Status: DC | PRN
  Filled 2015-12-25: qty 1

## 2015-12-25 MED ORDER — MORPHINE BOLUS VIA INFUSION
1.0000 mg | INTRAVENOUS | Status: DC | PRN
  Administered 2015-12-25: 1 mg via INTRAVENOUS
  Filled 2015-12-25: qty 1

## 2015-12-25 MED ORDER — MORPHINE SULFATE 25 MG/ML IV SOLN
0.5000 mg/h | INTRAVENOUS | Status: DC
  Administered 2015-12-25: 0.5 mg/h via INTRAVENOUS
  Filled 2015-12-25: qty 10

## 2015-12-25 MED ORDER — BIOTENE DRY MOUTH MT LIQD: 15.0000 mL | OROMUCOSAL | Status: DC | PRN

## 2015-12-25 MED ORDER — MORPHINE SULFATE 25 MG/ML IV SOLN
0.5000 mg/h | INTRAVENOUS | Status: DC
  Filled 2015-12-25: qty 10

## 2015-12-25 MED ORDER — GLYCOPYRROLATE 0.2 MG/ML IJ SOLN
0.2000 mg | INTRAMUSCULAR | Status: DC | PRN
  Filled 2015-12-25: qty 1

## 2015-12-25 MED ORDER — IPRATROPIUM BROMIDE 0.02 % IN SOLN
RESPIRATORY_TRACT | Status: AC
  Administered 2015-12-25: 0.5 mg
  Filled 2015-12-25: qty 2.5

## 2015-12-25 MED ORDER — HALOPERIDOL LACTATE 2 MG/ML PO CONC
0.5000 mg | ORAL | Status: DC | PRN
  Filled 2015-12-25: qty 0.3

## 2016-01-10 NOTE — Progress Notes (Signed)
Rt was not able to get a ABG The Pt's BP was 93/57, Hr 116, SAT was 82%. The Pt was on 14/7 BIPAP and 40%. RT raised the Pt's setting to 16/7 and 70% and her SATS increased to 90%. The Pt's tidal volumes are very low176-205.

## 2016-01-10 NOTE — Progress Notes (Signed)
[facility] called at [phone].  Patient is ruled out for eyes and tissue due to age.  Referral # [id number]

## 2016-01-10 NOTE — Progress Notes (Signed)
Notified Dr. Rito EhrlichKrishnan that Christine Garza, RRT was unable to get the patients ABG.  She states she stuck her twice with no success.

## 2016-01-10 NOTE — Progress Notes (Signed)
Patient appears to be resting comfortably Saturation 99 on 2.5lpm/[place] . Upon arrival to floor she was completely worn out. f 28 at the time. Treatment neb will be held for now due to her receiving so many nebs in er.

## 2016-01-10 NOTE — Progress Notes (Signed)
Inpatient Diabetes Program Recommendations  AACE/ADA: New Consensus Statement on Inpatient Glycemic Control (2015)  Target Ranges:  Prepandial:   less than 140 mg/dL      Peak postprandial:   less than 180 mg/dL (1-2 hours)      Critically ill patients:  140 - 180 mg/dL   Review of Glycemic Control  Diabetes history: None noted  Inpatient Diabetes Program Recommendations: Glucose elevated into 200's and 300's this am since solumedrol therapy has begun. Please consider using correction starting with sensitive tidwc and HS (if not eating, please consider q 4 hrs.)  Thank you Lenor CoffinAnn Ranesha Val, RN, MSN, CDE  Diabetes Inpatient Program Office: [phone] Pager: [phone] 8:00 am to 5:00 pm

## 2016-01-10 NOTE — Progress Notes (Signed)
Dr. Chancy MilroyS. Krishnan notified of the patients BP 69/21 HR 103.  The patient is asleep at this time per family.

## 2016-01-10 NOTE — Progress Notes (Signed)
Patient awakened tried going to bedside commode became extremely short of breath. 2 nebs given Patient cannot tolerate mask . Blow by and pipe given best as possible.

## 2016-01-10 NOTE — Progress Notes (Addendum)
No respirations and no heart beat auscultated by Johnsie CancelLisa Bullins RN and Onnie BoerJennifer Dierra Riesgo RN. Patients time of death [id number]. Dr. Rito EhrlichKrishnan notified

## 2016-01-10 NOTE — Progress Notes (Signed)
Pt having extremely difficult time breathing after getting up to bedside commode. Respiratory paged and neb. tx given. Md paged and orders given. Morphine 0.5mg  given IV times one dose.

## 2016-01-10 NOTE — Progress Notes (Addendum)
   01/06/2016 0840  Vitals  BP 115/65 mmHg  BP Location Left Arm  BP Method Automatic  Patient Position (if appropriate) Lying  Pulse Rate (!) 122  Pulse Rate Source Dinamap  Resp (!) 32   Went in to do my assessment on the patient and found that she was non responsive on the Bipap.  AEB she would not respond to voice, touch, or pain.  Her pupils slow reactive to light. The patients skin was clammy to touch her CBG was 228.   Her grandson was in the room and said this was a change from the last time he saw her.  Dr. Chancy MilroyS. Krishnan was notified of the situation and new orders was given and followed.  Dr. Chancy MilroyS. Krishnan was notified of the critical ABG report and he said he would see the patient.  He stated that at this point we will have to see how she does on the BIPAP.  Voiced to him that per policy we could not keep an continuous bipap patient on the floor.  He said he will most likely transfer her to the ICU.  The patients grandson was made aware of the POC and verbalizes understanding.

## 2016-01-10 NOTE — Discharge Summary (Signed)
Death Summary  Christine OremJosephine L Garza OZH:[id number]:[id number] DOB: 03-06-1935 DOA: 12-07-16  PCP: Milinda AntisURHAM, KAWANTA, MD PCP/Office notified: *Left message  Admit date: 12-07-16 Date of Death: 12/15/2015  Final Diagnoses:  Principal Problem:   Acute on chronic respiratory failure (HCC) Active Problems:   Dementia   COPD with acute exacerbation (HCC)   Hypertension    History of present illness:  Patient is a 81 year old female past medical history of advanced COPD secondary emphysema and bronchitis, dementia and hypertension as well as chronic respiratory failure on chronic home oxygen who was admitted on the evening of 1/15 for 1 day of progressively worsening shortness of breath along with cough. In the emergency room, chest x-ray noted left lung mass. Patient to the hospitalist service and started on additional oxygen, nebulizers and steroids.  Hospital Course:  Following admission, patient's respiratory status greatly declined crying more and more oxygen and then over to BiPAP. An ABG checked approximately one hour after on BiPAP noted a markedly low pH of 7.0 with a PCO2 of greater than 120. Patient's family had already clarified that she is to be a DO NOT RESUSCITATE. After discussion with the family they agreed to give her trial of BiPAP and repeat an ABG and if she did not show improvement to make her comfortable. Repeat ABG was attempted approximately 5-6 hours later but was unable to be obtained. After discussion with family, the degree patient was made comfortable. She was started on a morphine drip and BiPAP was discontinued. Patient was put on oxygen Venturi mask. She passed away about one hour later.   Time: 1837  Signed:  Hollice EspyKRISHNAN,SENDIL K  [facility] 12/21/2015, 6:43 PM

## 2016-01-10 NOTE — Progress Notes (Signed)
Placed patient on BiPAP due to increased labored breathing patient given inline nebulizer with BiPAP 5.0mg  albuterol/ atrovent 0.5 mg  BiPAP 13/5 f 10  Fio2 60 percent. Patient appears small amount better

## 2016-01-10 DEATH — deceased

## 2016-02-02 ENCOUNTER — Ambulatory Visit: Payer: Medicare Other | Admitting: Family Medicine

## 2016-02-23 IMAGING — CT CT CHEST W/O CM
2 of 3 series · 15 of 36 positions shown, 18 images · non-contrast
Comparison: Chest radiographic examination is February 04, 2015 and
December 24, 2015

CLINICAL DATA: Parenchymal lung lesion on chest radiograph

EXAM:
CT CHEST WITHOUT CONTRAST
TECHNIQUE: Multidetector CT imaging of the chest was performed following the
standard protocol without IV contrast.

[Series 2: chestroutine 5.0 b40f · axial · 0.66mm/px · z∈[-373,-73]mm · 12 of 72 slices shown, 15 images]
[im 6/72  mediastinal]
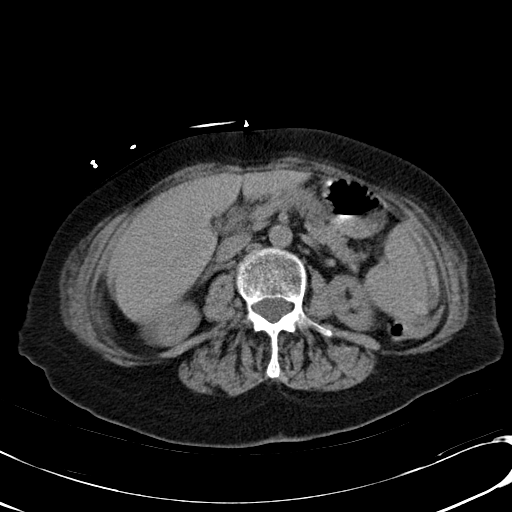
[im 6/72  lung]
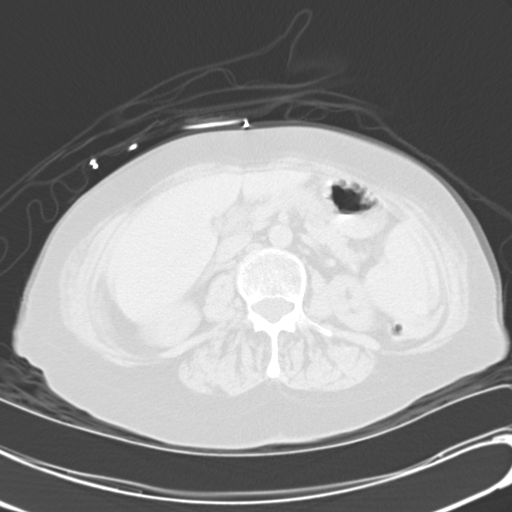
[im 11/72  lung]
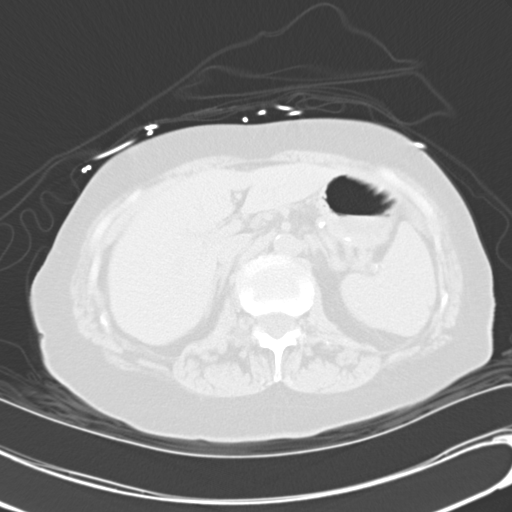
[im 16/72  lung]
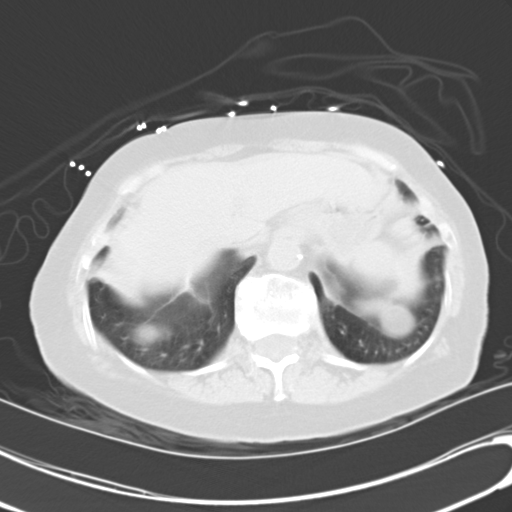
[im 22/72  lung]
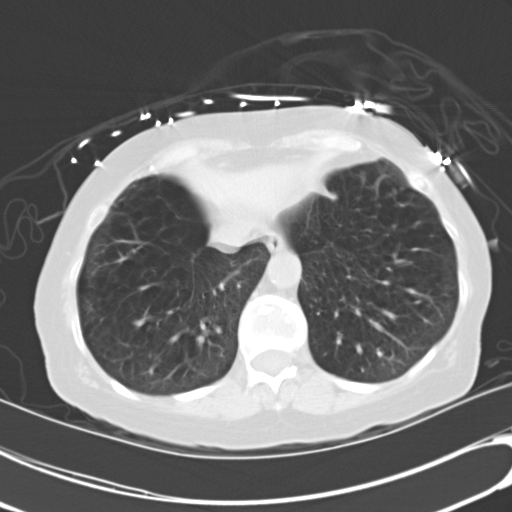
[im 27/72  mediastinal]
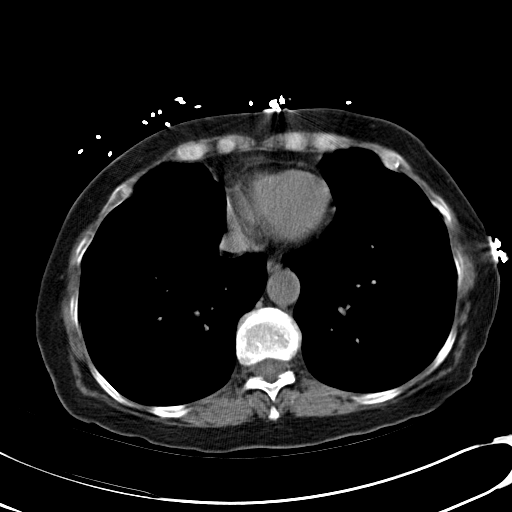
[im 27/72  lung]
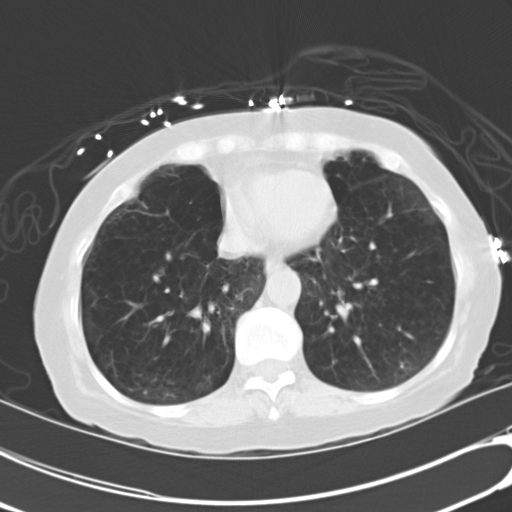
[im 32/72  lung]
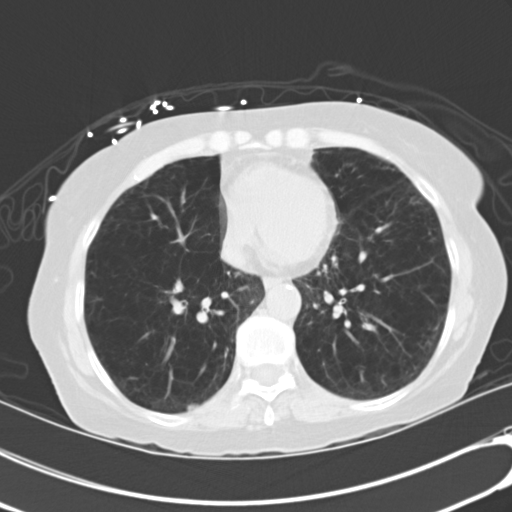
[im 40/72  lung]
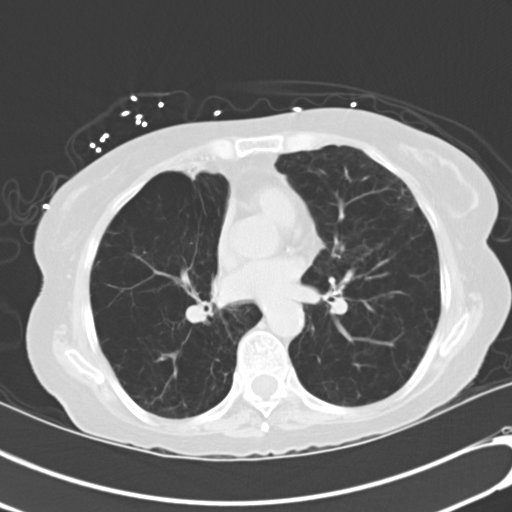
[im 45/72  lung]
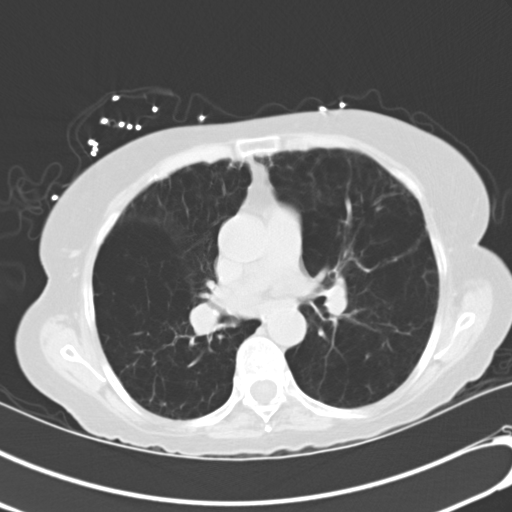
[im 50/72  mediastinal]
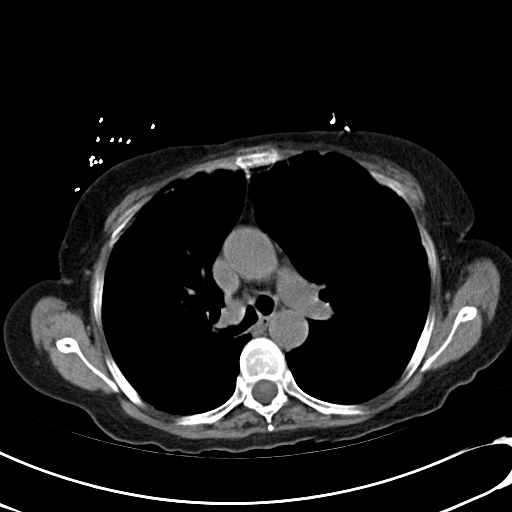
[im 50/72  lung]
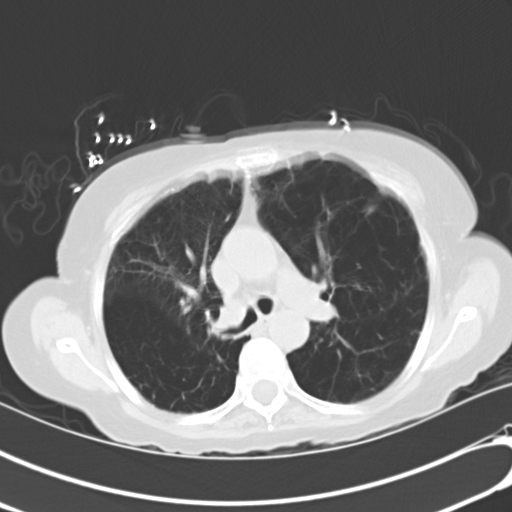
[im 56/72  lung]
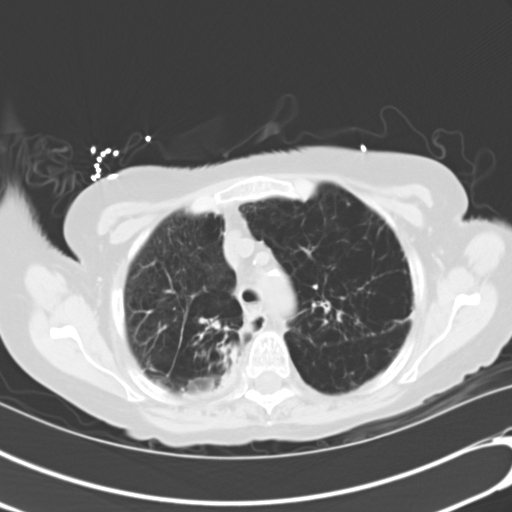
[im 61/72  lung]
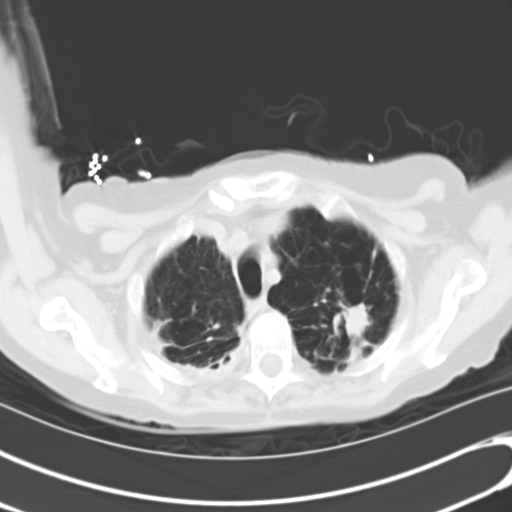
[im 66/72  lung]
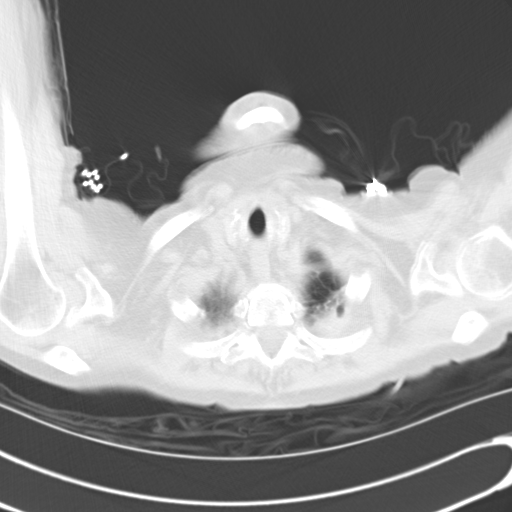

[Series 4: mpr coro 3mm · coronal · 0.64mm/px · 3 of 79 slices shown]
[im 16/79  lung]
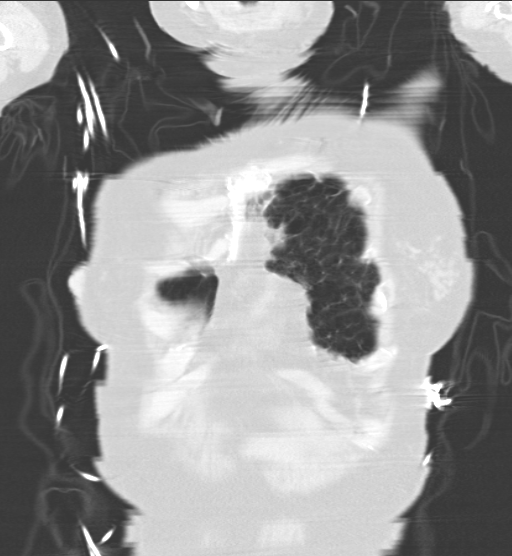
[im 32/79  lung]
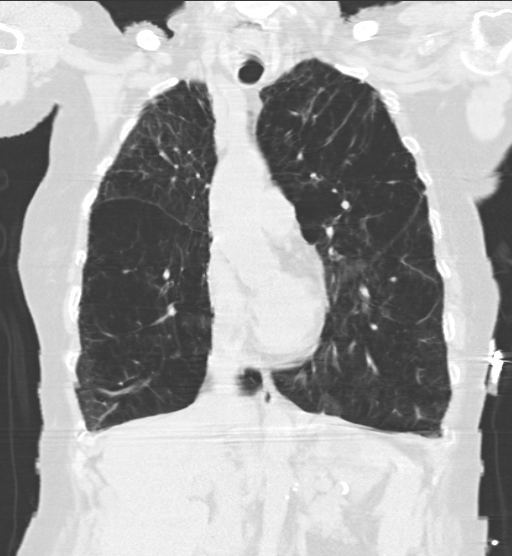
[im 47/79  lung]
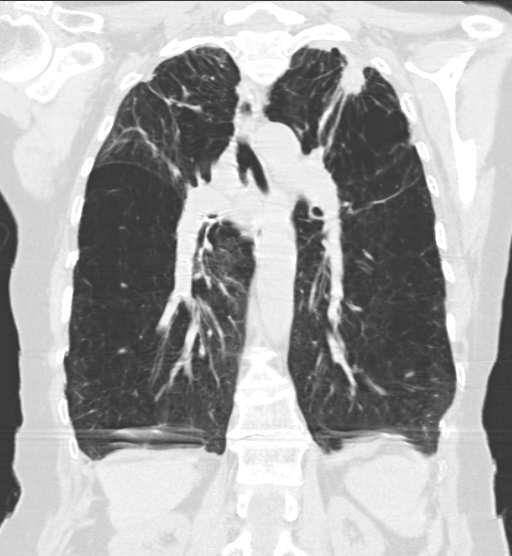

[15 of 36 positions shown; findings below may reference images not displayed]

FINDINGS: Mediastinum/Lymph Nodes: Thyroid appears unremarkable. There is no
demonstrable thoracic adenopathy. There are scattered foci of
coronary artery calcification. There is atherosclerotic
calcification in the aorta without demonstrable aneurysm. The
visualized great vessels appear unremarkable except for mild
atherosclerotic calcification. The pericardium is not thickened.

Lungs/Pleura: In the area of concern on chest radiograph, there is
an irregular spiculated mass in the posterior left apex measuring
2.9 x 2.2 cm. There is scarring in both apices, more on the right
than on the left with the scarring in the right apex appearing
stable by chest radiography. There is underlying emphysematous
change with scattered areas of scarring bilaterally. There is no
appreciable pleural effusion or pleural thickening. No edema or
consolidation.

Upper abdomen: In the visualized upper abdominal region, there is
incomplete visualization of a noncystic mass arising from the right
kidney measuring 2.5 x 2.3 cm. There is atherosclerotic
calcification in the aorta. Adrenals appear normal bilaterally.
Visualized upper abdominal structures otherwise appear unremarkable.

Musculoskeletal: There is mild anterior wedging of the T11 vertebral
body. There is degenerative change at T12-L1. There are no blastic
or lytic bone lesions.
IMPRESSION: Spiculated lesion in the left apex concerning for neoplasm. This
appearance warrants nuclear medicine PET study to further assess.

Underlying emphysema with scattered areas of scarring in the lungs,
most notably in the right apex and anterior segment left upper lobe
regions. No edema or consolidation.

No apparent adenopathy.  No adrenal lesions.

Incomplete visualization of noncystic right renal mass. Further
evaluation warranted. In this regard, nonemergent pre and
post-contrast MR or CT of the kidneys advised to further assess.

Atherosclerotic calcification with areas of coronary artery
calcification noted.
